# Patient Record
Sex: Male | Born: 1960 | Race: Black or African American | Hispanic: No | Marital: Single | Smoking: Current every day smoker
Health system: Southern US, Community
[De-identification: ages and names within clinical notes are randomized; demographics above are authoritative.]

## PROBLEM LIST (undated history)

## (undated) DIAGNOSIS — I10 Essential (primary) hypertension: Secondary | ICD-10-CM

## (undated) DIAGNOSIS — D649 Anemia, unspecified: Secondary | ICD-10-CM

## (undated) DIAGNOSIS — K219 Gastro-esophageal reflux disease without esophagitis: Secondary | ICD-10-CM

## (undated) DIAGNOSIS — F32A Depression, unspecified: Secondary | ICD-10-CM

## (undated) DIAGNOSIS — R531 Weakness: Secondary | ICD-10-CM

## (undated) DIAGNOSIS — E785 Hyperlipidemia, unspecified: Secondary | ICD-10-CM

## (undated) DIAGNOSIS — F329 Major depressive disorder, single episode, unspecified: Secondary | ICD-10-CM

## (undated) DIAGNOSIS — G35 Multiple sclerosis: Secondary | ICD-10-CM

## (undated) DIAGNOSIS — R4781 Slurred speech: Secondary | ICD-10-CM

## (undated) DIAGNOSIS — R1310 Dysphagia, unspecified: Secondary | ICD-10-CM

## (undated) DIAGNOSIS — E119 Type 2 diabetes mellitus without complications: Secondary | ICD-10-CM

## (undated) HISTORY — PX: APPENDECTOMY: SHX54

---

## 1998-08-02 ENCOUNTER — Emergency Department (HOSPITAL_COMMUNITY): Admission: EM | Admit: 1998-08-02 | Discharge: 1998-08-02 | Payer: Self-pay | Admitting: Emergency Medicine

## 2001-12-12 ENCOUNTER — Emergency Department (HOSPITAL_COMMUNITY): Admission: EM | Admit: 2001-12-12 | Discharge: 2001-12-12 | Payer: Self-pay

## 2004-08-27 ENCOUNTER — Encounter: Admission: RE | Admit: 2004-08-27 | Discharge: 2004-08-27 | Payer: Self-pay | Admitting: Neurology

## 2005-06-29 ENCOUNTER — Ambulatory Visit: Payer: Self-pay | Admitting: Gastroenterology

## 2005-07-06 ENCOUNTER — Ambulatory Visit: Payer: Self-pay | Admitting: Gastroenterology

## 2005-07-06 ENCOUNTER — Encounter (INDEPENDENT_AMBULATORY_CARE_PROVIDER_SITE_OTHER): Payer: Self-pay | Admitting: *Deleted

## 2005-08-14 ENCOUNTER — Ambulatory Visit: Payer: Self-pay | Admitting: Gastroenterology

## 2007-06-26 ENCOUNTER — Inpatient Hospital Stay (HOSPITAL_COMMUNITY): Admission: EM | Admit: 2007-06-26 | Discharge: 2007-06-28 | Payer: Self-pay | Admitting: Emergency Medicine

## 2008-10-06 ENCOUNTER — Encounter (INDEPENDENT_AMBULATORY_CARE_PROVIDER_SITE_OTHER): Payer: Self-pay | Admitting: *Deleted

## 2008-11-23 ENCOUNTER — Ambulatory Visit: Payer: Self-pay | Admitting: Gastroenterology

## 2008-11-23 DIAGNOSIS — E119 Type 2 diabetes mellitus without complications: Secondary | ICD-10-CM | POA: Insufficient documentation

## 2008-11-23 DIAGNOSIS — K219 Gastro-esophageal reflux disease without esophagitis: Secondary | ICD-10-CM

## 2008-12-09 ENCOUNTER — Ambulatory Visit: Payer: Self-pay | Admitting: Gastroenterology

## 2008-12-09 ENCOUNTER — Encounter: Payer: Self-pay | Admitting: Gastroenterology

## 2008-12-10 ENCOUNTER — Encounter: Payer: Self-pay | Admitting: Gastroenterology

## 2009-07-09 ENCOUNTER — Emergency Department (HOSPITAL_COMMUNITY): Admission: EM | Admit: 2009-07-09 | Discharge: 2009-07-09 | Payer: Self-pay | Admitting: Emergency Medicine

## 2010-06-02 LAB — GLUCOSE, CAPILLARY
Glucose-Capillary: 71 mg/dL (ref 70–99)
Glucose-Capillary: 90 mg/dL (ref 70–99)

## 2010-07-12 NOTE — H&P (Signed)
Mario Shields, Mario Shields                 ACCOUNT NO.:  [id number]   MEDICAL RECORD NO.:  [id number]          PATIENT TYPE:  INP   LOCATION:  6707                         FACILITY:  [facility]   PHYSICIAN:  Tera Mater. [place], M.D. DATE OF BIRTH:  June 10, 1960   DATE OF ADMISSION:  06/26/2007  DATE OF DISCHARGE:                              HISTORY & PHYSICAL   HISTORY OF PRESENT ILLNESS:  Mr. Swab is a 50 year old black male with  a history of type 2 diabetes diagnosed about a year ago, a history of  long-standing multiple sclerosis, a history of hyperlipidemia and  hypertension.  He has been feeling less well for about the last week to  10 days.  He has had frequent urination, extra thirst, blurred vision,  some headaches, some weakness, and just general malaise.  He thought it  might be a flare of his MS because he has had flares like this in the  past.  He felt so bad on the last day that he really could not get much  out of bed.  His blood sugar was above the level of meter today, and he  was brought to the emergency room for evaluation.  Here, the initial  evaluation suggested early diabetic ketoacidosis; and the patient is now  being admitted to evaluate this.  The patient's last hemoglobin A1c in  our office was 6.8% in January 2009 on metformin.  He has had some  weight loss, but he has not really measured it.  He notes no breathing  trouble, no chest pain.  He threw up one time today.  He notes no  abdominal pain.  No other aches and pains at the present time.   PAST MEDICAL HISTORY:  Past medical history includes a  relapsing/remitting multiple sclerosis since 1992 treated with biologic  agents in the form of Avonex.  He has a history hypertension, a history  of hyperlipidemia, history of depression, history of gastroesophageal  reflux and esophageal stricture.   SOCIAL HISTORY:  Social history reveals he is single.  He quit smoking 3  weeks ago.  He is disabled Dispensing optician.   FAMILY HISTORY:  Father died of cancer of the lung.  Mother has  hypertension, coronary disease, diabetes, and osteoarthritis.   PAST SURGICAL HISTORY:  Other past history includes appendectomy and  esophageal dilation.   ALLERGIES:  Allergies include an intolerance to BETASERON which causes  myalgias.  No drug allergies are noted.   MEDICATIONS:  His medication list at the present includes  1. Diovan HCT; we are not sure of the dose.  2. Aciphex 20.  3. Lipitor 20.  4. Metformin at a dose of 500 once a day started in June 2008.  5. Avonex weekly; we do not have the dose.   PHYSICAL EXAMINATION:  VITAL SIGNS:  On exam here in the emergency room,  blood pressure initially is 144/92, pulse 105 in a sinus tachycardia,  respirations 18 and unlabored.  No Kussmaul pattern, O2 saturation is  97%.  Temperature is 100.4.  GENERAL:  We have a thin but  fairly healthy-appearing black male in no  distress.  HEENT:  Sclerae anicteric.  Extraocular movements are intact with no lid  lag or nystagmus.  Nasopharynx is clear with no darkening or abnormal  findings.  There is a slight wax impaction in the left ear.  Right  tympanic membrane is clear.  Oral mucous membranes are dry but clear  with no thrush or other lesions.  NECK:  Supple without JVD, bruits, or thyromegaly.  LUNGS: Clear without wheezes, rales, or rhonchi.  No accessory muscles  are in use.  HEART:  Regular and somewhat hyperdynamic with a systolic flow murmur.  ABDOMEN:  Entirely soft, nontender with hypoactive but present in all  quadrant bowel sounds.  EXTREMITIES:  Strong distal pulses, no edemas.  SKIN:  Skin turgor is reasonably good with no tenting.  No rashes are  noted.  NEUROLOGIC:  The patient is awake, alert.  His mentation is excellent.  Speech is clear.  No tremors present.  Grip appears equal.  Gait is not  tested.   LABORATORY DATA:  His blood gas, pH 7.331, PCO2 of 35, venous pO2 of 35,  and we do not seem to  have a O2 on this particular finding.  O2  saturation was 63%.  This was a venous sample.  His sodium was 136,  potassium 4.4, chloride 100, CO2 of 17, BUN 20, creatinine 1.37, glucose  559, and calcium is 10.1.  Serum ketones are small and present as blood  acetone.  White count is 11,900, hemoglobin 16.2, platelets 229,000, and  MCV is 87.5.   ASSESSMENT:  In summary, we have a 50 year old black male with a history  of diabetes type 2 now presenting with what appears to be early diabetic  ketoacidosis.  He has a volume deficit, diminished bicarbonate, elevated  anion gap with a sugar greater than 500.  The patient is not too  terribly toxic at the present.  No evidence of rhabdomyolysis or  underlying infection causing this.  I am concerned that he is becoming  insulin deficient, however.  The fact that he does have serum ketone  response in the blood stream is worrisome.  At the present, it can  treated with IV Glucommander, fluids and reassess the situation as time  goes by here.  His electrolytes are in reasonable disorder, and he has  no evidence of renal dysfunction of note.  We will need to watch his  potassium carefully.  A C-peptide will be checked and will give [place] a bit  of an insight into whether or not he is insulin deficient.  During this  stressful time, it of course should be quite high.  GAD antibodies are  much less predictive in African Americans, but we are going to try it  and see; if it is positive, it will be very telling test for [place].  He  will probably be transitioned to insulin tomorrow depending on how he  does.  All of this was explained in detail to the patient, and questions  were answered.           ______________________________  Tera Mater Evlyn Kanner, M.D.     SAS/MEDQ  D:  06/26/2007  T:  06/27/2007  Job:  [id number]

## 2010-07-15 NOTE — Discharge Summary (Signed)
Mario Shields, Mario Shields                 ACCOUNT NO.:  [id number]   MEDICAL RECORD NO.:  [id number]          PATIENT TYPE:  INP   LOCATION:  6707                         FACILITY:  [facility]   PHYSICIAN:  Barry Dienes. Eloise Harman, M.D.DATE OF BIRTH:  Aug 26, 1960   DATE OF ADMISSION:  06/26/2007  DATE OF DISCHARGE:  06/28/2007                               DISCHARGE SUMMARY   PERTINENT FINDINGS:  The patient is a 50 year old African-American man  with a history of diabetes mellitus type 2, multiple sclerosis,  hypertension, and gastroesophageal reflux who presented to the emergency  room with polyuria, polydipsia, and fatigue.  The emergency room workup  was also notable for mild ketoacidosis and the patient was admitted for  further evaluation.  See admission history and physical for details of  his condition at admission.   HOSPITAL COURSE:  The patient was admitted to a medical bed without  telemetry.  He was started on IV insulin via the Glucommander system as  his initial blood glucose level was 559 with total CO2 of 17 and venous  pH of 7.33.  His blood glucose levels rapidly normalized and he was  switched to Lantus insulin with sliding scale NovoLog insulin as needed.  He was also treated with high dose IV fluids as he was somewhat  dehydrated on admission.  Nursing staff showed him initial information  about how to administer insulin and he was given a glucose monitor and  shown how to use this prior to discharge.   PROCEDURES:  IV insulin via Glucommander.   CONSULTATIONS:  None.   CONDITION ON DISCHARGE:  He felt at his baseline with no nausea,  headaches, or dizziness.  His vision was still a bit blurry.   PHYSICAL EXAMINATION:  VITAL SIGNS:  Most recent vital signs include  blood pressure 130/82, pulse 66, respirations 18, temperature 98.6,  pulse oxygen saturation 98% on room air.  GENERAL:  He is a well-nourished, well-developed African-American man  who is in no apparent  distress.  CHEST:  Clear to auscultation.  HEART:  Regular rate and rhythm.  NEUROLOGIC:  Significant that he was alert and well-oriented.  He could  move all extremities well and walk without difficulty.   PERTINENT LABORATORY TESTS:  Serum sodium 139, potassium 3.9, chloride  111, carbon dioxide 22, BUN 7, creatinine 0.94, glucose 250.  Most  recent capillary blood glucose levels have been 377 and 251.  White  blood cell count was 12.8 with hemoglobin 12.8, hematocrit 37.3,  platelets 187, and 74% neutrophils.  A C-peptide level was 0.76 (0.80-  3.90) at the time and the blood glucose level was approximately 250.  A  glutamic acid decarboxylase level was less than 1.0 (less than 1.5  units/mL).   DISCHARGE DIAGNOSES:  1. Diabetes mellitus type 1, new onset.  2. Diabetic ketoacidosis, mild and resolved.  3. Hypertension.  4. Gastroesophageal reflux disease.  5. Multiple sclerosis.  6. Hyperlipidemia.   DISCHARGE MEDICATIONS:  1. Lantus insulin 20 units subcutaneous every a.m.  2. NovoLog insulin before meals based on premeal fasting glucose level  as follows:  Blood glucose level less than 100 give no insulin, for      100-150 give 2 units, for 151-200 give 4 units, for over 200 give 6      units.  3. Diovan HCT 1 tablet daily.  4. Aciphex 20 mg daily.  5. Avonex once weekly per usual.  6. Lipitor 20 mg daily.   DISPOSITION AND FOLLOWUP:  He was advised to see our office diabetes  educator, Edison Pace next week and was given a telephone number to  schedule that visit.  He was also advised to see [name] at  [facility] in approximately 2 weeks following  discharge.           ______________________________  Barry Dienes. Eloise Harman, M.D.     DGP/MEDQ  D:  08/01/2007  T:  08/01/2007  Job:  [id number]

## 2010-11-22 LAB — BASIC METABOLIC PANEL WITH GFR
BUN: 11
BUN: 13
CO2: 17 — ABNORMAL LOW
CO2: 22
CO2: 26
Calcium: 8.9
Calcium: 9.3
Creatinine, Ser: 0.8
Creatinine, Ser: 0.92
GFR calc non Af Amer: >60
GFR calc non Af Amer: >60
Glucose, Bld: 221 — ABNORMAL HIGH
Glucose, Bld: 559
Glucose, Bld: 73
Potassium: 4.4
Sodium: 136
Sodium: 142
Sodium: 144

## 2010-11-22 LAB — URINALYSIS, ROUTINE W REFLEX MICROSCOPIC
Bilirubin Urine: NEGATIVE
Glucose, UA: >1000 — AB
Hgb urine dipstick: NEGATIVE
Ketones, ur: >80 — AB
Leukocytes, UA: NEGATIVE
Nitrite: NEGATIVE
Protein, ur: NEGATIVE
Specific Gravity, Urine: 1.039 — ABNORMAL HIGH
Urobilinogen, UA: 0.2
pH: 5

## 2010-11-22 LAB — DIFFERENTIAL
Basophils Absolute: 0
Basophils Relative: 0
Eosinophils Absolute: 0
Eosinophils Relative: 0
Lymphocytes Relative: 20
Lymphs Abs: 2.4
Monocytes Absolute: 0.6
Monocytes Relative: 5
Neutro Abs: 8.8 — ABNORMAL HIGH
Neutrophils Relative %: 74

## 2010-11-22 LAB — POCT I-STAT 3, VENOUS BLOOD GAS (G3P V)
Acid-base deficit: 6 — ABNORMAL HIGH
Bicarbonate: 18.8 — ABNORMAL LOW
O2 Saturation: 63
Operator id: 284251
TCO2: 20
pCO2, Ven: 35.6 — ABNORMAL LOW
pH, Ven: 7.331 — ABNORMAL HIGH
pO2, Ven: 35

## 2010-11-22 LAB — CBC
HCT: 37.3 — ABNORMAL LOW
HCT: 45.6
Hemoglobin: 12.8 — ABNORMAL LOW
Hemoglobin: 16.2
MCHC: 34.3
MCHC: 35.6
MCV: 87.5
MCV: 88.6
Platelets: 187
Platelets: 229
RBC: 4.21 — ABNORMAL LOW
RBC: 5.21
RDW: 12.7
RDW: 13.1
WBC: 11.9 — ABNORMAL HIGH
WBC: 12.8 — ABNORMAL HIGH

## 2010-11-22 LAB — BASIC METABOLIC PANEL
BUN: 20
Calcium: 10.1
Chloride: 100
Chloride: 111
Chloride: 115 — ABNORMAL HIGH
Creatinine, Ser: 1.37
GFR calc Af Amer: >60
GFR calc Af Amer: >60
GFR calc Af Amer: >60
GFR calc non Af Amer: 55 — ABNORMAL LOW
Potassium: 4.1
Potassium: 4.4

## 2010-11-22 LAB — GLUTAMIC ACID DECARBOXYLASE AUTO ABS: Glutamic Acid Decarb Ab: <1 U/mL (ref <1.5)

## 2010-11-22 LAB — URINE MICROSCOPIC-ADD ON

## 2010-11-22 LAB — KETONES, QUALITATIVE

## 2010-11-22 LAB — C-PEPTIDE: C-Peptide: 0.76 — ABNORMAL LOW

## 2012-04-15 ENCOUNTER — Encounter (HOSPITAL_COMMUNITY): Payer: Self-pay | Admitting: *Deleted

## 2012-04-15 ENCOUNTER — Emergency Department (HOSPITAL_COMMUNITY): Payer: Medicare Other

## 2012-04-15 ENCOUNTER — Inpatient Hospital Stay (HOSPITAL_COMMUNITY)
Admission: EM | Admit: 2012-04-15 | Discharge: 2012-04-19 | DRG: 060 | Disposition: A | Discharge status: Home Health | Payer: Medicare Other | Attending: Internal Medicine | Admitting: Internal Medicine

## 2012-04-15 ENCOUNTER — Inpatient Hospital Stay (HOSPITAL_COMMUNITY): Payer: Medicare Other

## 2012-04-15 DIAGNOSIS — D649 Anemia, unspecified: Secondary | ICD-10-CM | POA: Diagnosis present

## 2012-04-15 DIAGNOSIS — E119 Type 2 diabetes mellitus without complications: Secondary | ICD-10-CM

## 2012-04-15 DIAGNOSIS — Z794 Long term (current) use of insulin: Secondary | ICD-10-CM

## 2012-04-15 DIAGNOSIS — R2681 Unsteadiness on feet: Secondary | ICD-10-CM

## 2012-04-15 DIAGNOSIS — E785 Hyperlipidemia, unspecified: Secondary | ICD-10-CM | POA: Diagnosis present

## 2012-04-15 DIAGNOSIS — E109 Type 1 diabetes mellitus without complications: Secondary | ICD-10-CM | POA: Diagnosis present

## 2012-04-15 DIAGNOSIS — G8190 Hemiplegia, unspecified affecting unspecified side: Secondary | ICD-10-CM

## 2012-04-15 DIAGNOSIS — I10 Essential (primary) hypertension: Secondary | ICD-10-CM | POA: Diagnosis present

## 2012-04-15 DIAGNOSIS — R531 Weakness: Secondary | ICD-10-CM

## 2012-04-15 DIAGNOSIS — G35 Multiple sclerosis: Principal | ICD-10-CM | POA: Diagnosis present

## 2012-04-15 DIAGNOSIS — F17200 Nicotine dependence, unspecified, uncomplicated: Secondary | ICD-10-CM | POA: Diagnosis present

## 2012-04-15 DIAGNOSIS — K219 Gastro-esophageal reflux disease without esophagitis: Secondary | ICD-10-CM | POA: Diagnosis present

## 2012-04-15 DIAGNOSIS — R209 Unspecified disturbances of skin sensation: Secondary | ICD-10-CM

## 2012-04-15 DIAGNOSIS — Z79899 Other long term (current) drug therapy: Secondary | ICD-10-CM

## 2012-04-15 HISTORY — DX: Essential (primary) hypertension: I10

## 2012-04-15 HISTORY — DX: Multiple sclerosis: G35

## 2012-04-15 LAB — COMPREHENSIVE METABOLIC PANEL
Alkaline Phosphatase: 93 U/L (ref 39–117)
BUN: 11 mg/dL (ref 6–23)
GFR calc Af Amer: >90 mL/min (ref >90)
Glucose, Bld: 416 mg/dL — ABNORMAL HIGH (ref 70–99)
Potassium: 4.2 mEq/L (ref 3.5–5.1)
Total Bilirubin: 0.3 mg/dL (ref 0.3–1.2)
Total Protein: 7.8 g/dL (ref 6.0–8.3)

## 2012-04-15 LAB — URINALYSIS, ROUTINE W REFLEX MICROSCOPIC
Bilirubin Urine: NEGATIVE
Ketones, ur: 15 mg/dL — AB
Nitrite: NEGATIVE
Protein, ur: NEGATIVE mg/dL
Specific Gravity, Urine: 1.037 — ABNORMAL HIGH (ref 1.005–1.030)
Urobilinogen, UA: 0.2 mg/dL (ref 0.0–1.0)

## 2012-04-15 LAB — CBC WITH DIFFERENTIAL/PLATELET
Basophils Absolute: 0 10*3/uL (ref 0.0–0.1)
HCT: 46.7 % (ref 39.0–52.0)
Lymphs Abs: 4.2 10*3/uL — ABNORMAL HIGH (ref 0.7–4.0)
MCH: 31.4 pg (ref 26.0–34.0)
MCHC: 36.2 g/dL — ABNORMAL HIGH (ref 30.0–36.0)
MCV: 86.8 fL (ref 78.0–100.0)
Monocytes Relative: 7 % (ref 3–12)
Neutro Abs: 9.9 10*3/uL — ABNORMAL HIGH (ref 1.7–7.7)
RDW: 13 % (ref 11.5–15.5)
WBC: 15.4 10*3/uL — ABNORMAL HIGH (ref 4.0–10.5)

## 2012-04-15 LAB — GLUCOSE, CAPILLARY
Glucose-Capillary: 305 mg/dL — ABNORMAL HIGH (ref 70–99)
Glucose-Capillary: 418 mg/dL — ABNORMAL HIGH (ref 70–99)

## 2012-04-15 MED ORDER — ONDANSETRON HCL 4 MG/2ML IJ SOLN: 4.0000 mg | Freq: Four times a day (QID) | INTRAMUSCULAR | Status: DC | PRN

## 2012-04-15 MED ORDER — METHYLPREDNISOLONE SODIUM SUCC 1000 MG IJ SOLR
1000.0000 mg | Freq: Every day | INTRAMUSCULAR | Status: AC
  Administered 2012-04-15 – 2012-04-19 (×5): 1000 mg via INTRAVENOUS
  Filled 2012-04-15 (×7): qty 8

## 2012-04-15 MED ORDER — SODIUM CHLORIDE 0.9 % IV SOLN
INTRAVENOUS | Status: DC
  Administered 2012-04-15: 1000 mL via INTRAVENOUS
  Administered 2012-04-16 – 2012-04-17 (×5): via INTRAVENOUS
  Administered 2012-04-17: 1000 mL via INTRAVENOUS
  Administered 2012-04-19: 50 mL/h via INTRAVENOUS

## 2012-04-15 MED ORDER — ADULT MULTIVITAMIN W/MINERALS CH
1.0000 | ORAL_TABLET | Freq: Every day | ORAL | Status: DC
  Administered 2012-04-16 – 2012-04-19 (×4): 1 via ORAL
  Filled 2012-04-15 (×4): qty 1

## 2012-04-15 MED ORDER — POLYETHYLENE GLYCOL 3350 17 G PO PACK
17.0000 g | PACK | Freq: Every day | ORAL | Status: DC | PRN
  Filled 2012-04-15: qty 1

## 2012-04-15 MED ORDER — IRBESARTAN 300 MG PO TABS
300.0000 mg | ORAL_TABLET | Freq: Every day | ORAL | Status: DC
  Administered 2012-04-16 – 2012-04-19 (×4): 300 mg via ORAL
  Filled 2012-04-15 (×4): qty 1

## 2012-04-15 MED ORDER — WHITE PETROLATUM GEL
Status: AC
  Administered 2012-04-15: 23:00:00
  Filled 2012-04-15: qty 5

## 2012-04-15 MED ORDER — ONDANSETRON HCL 4 MG PO TABS: 4.0000 mg | ORAL_TABLET | Freq: Four times a day (QID) | ORAL | Status: DC | PRN

## 2012-04-15 MED ORDER — VALSARTAN-HYDROCHLOROTHIAZIDE 320-12.5 MG PO TABS: 1.0000 | ORAL_TABLET | Freq: Every day | ORAL | Status: DC

## 2012-04-15 MED ORDER — INSULIN ASPART 100 UNIT/ML ~~LOC~~ SOLN
10.0000 [IU] | Freq: Once | SUBCUTANEOUS | Status: AC
  Administered 2012-04-15: 10 [IU] via INTRAVENOUS
  Filled 2012-04-15: qty 1

## 2012-04-15 MED ORDER — SODIUM CHLORIDE 0.9 % IV BOLUS (SEPSIS): 500.0000 mL | Freq: Once | INTRAVENOUS | Status: DC

## 2012-04-15 MED ORDER — ENOXAPARIN SODIUM 40 MG/0.4ML ~~LOC~~ SOLN
40.0000 mg | SUBCUTANEOUS | Status: DC
Start: 2012-04-15 — End: 2012-04-19
  Administered 2012-04-15 – 2012-04-18 (×4): 40 mg via SUBCUTANEOUS
  Filled 2012-04-15 (×5): qty 0.4

## 2012-04-15 MED ORDER — ZOLPIDEM TARTRATE 5 MG PO TABS
5.0000 mg | ORAL_TABLET | Freq: Every evening | ORAL | Status: DC | PRN
  Administered 2012-04-19: 5 mg via ORAL
  Filled 2012-04-15: qty 1

## 2012-04-15 MED ORDER — ALUM & MAG HYDROXIDE-SIMETH 200-200-20 MG/5ML PO SUSP
30.0000 mL | Freq: Four times a day (QID) | ORAL | Status: DC | PRN
  Administered 2012-04-15: 30 mL via ORAL
  Filled 2012-04-15: qty 30

## 2012-04-15 MED ORDER — SODIUM CHLORIDE 0.9 % IV BOLUS (SEPSIS)
500.0000 mL | Freq: Once | INTRAVENOUS | Status: AC
  Administered 2012-04-15 (×2): 500 mL via INTRAVENOUS

## 2012-04-15 MED ORDER — HYDROCODONE-ACETAMINOPHEN 5-325 MG PO TABS
1.0000 | ORAL_TABLET | ORAL | Status: DC | PRN
  Filled 2012-04-15: qty 2

## 2012-04-15 MED ORDER — ACETAMINOPHEN 650 MG RE SUPP: 650.0000 mg | Freq: Four times a day (QID) | RECTAL | Status: DC | PRN

## 2012-04-15 MED ORDER — INSULIN ASPART 100 UNIT/ML ~~LOC~~ SOLN
0.0000 [IU] | SUBCUTANEOUS | Status: DC
  Administered 2012-04-15: 8 [IU] via SUBCUTANEOUS
  Administered 2012-04-16 (×2): 11 [IU] via SUBCUTANEOUS
  Administered 2012-04-16: 5 [IU] via SUBCUTANEOUS
  Administered 2012-04-16: 8 [IU] via SUBCUTANEOUS
  Administered 2012-04-16: 11 [IU] via SUBCUTANEOUS
  Administered 2012-04-17: 5 [IU] via SUBCUTANEOUS
  Administered 2012-04-17 (×2): 3 [IU] via SUBCUTANEOUS
  Administered 2012-04-17: 5 [IU] via SUBCUTANEOUS
  Administered 2012-04-17 (×2): 15 [IU] via SUBCUTANEOUS
  Administered 2012-04-17: 3 [IU] via SUBCUTANEOUS
  Administered 2012-04-18: 8 [IU] via SUBCUTANEOUS
  Administered 2012-04-18: 5 [IU] via SUBCUTANEOUS
  Administered 2012-04-18: 8 [IU] via SUBCUTANEOUS
  Administered 2012-04-18: 2 [IU] via SUBCUTANEOUS
  Administered 2012-04-19 (×2): 3 [IU] via SUBCUTANEOUS
  Administered 2012-04-19: 5 [IU] via SUBCUTANEOUS
  Administered 2012-04-19: 2 [IU] via SUBCUTANEOUS
  Filled 2012-04-15: qty 1

## 2012-04-15 MED ORDER — PANTOPRAZOLE SODIUM 40 MG PO TBEC
40.0000 mg | DELAYED_RELEASE_TABLET | Freq: Every day | ORAL | Status: DC
  Administered 2012-04-16 – 2012-04-19 (×4): 40 mg via ORAL
  Filled 2012-04-15 (×4): qty 1

## 2012-04-15 MED ORDER — HYDROCHLOROTHIAZIDE 12.5 MG PO CAPS
12.5000 mg | ORAL_CAPSULE | Freq: Every day | ORAL | Status: DC
  Administered 2012-04-16 – 2012-04-17 (×2): 12.5 mg via ORAL
  Filled 2012-04-15 (×2): qty 1

## 2012-04-15 MED ORDER — ACETAMINOPHEN 325 MG PO TABS: 650.0000 mg | ORAL_TABLET | Freq: Four times a day (QID) | ORAL | Status: DC | PRN

## 2012-04-15 MED ORDER — GADOBENATE DIMEGLUMINE 529 MG/ML IV SOLN
15.0000 mL | Freq: Once | INTRAVENOUS | Status: AC | PRN
  Administered 2012-04-15: 15 mL via INTRAVENOUS

## 2012-04-15 MED ORDER — INSULIN GLARGINE 100 UNIT/ML ~~LOC~~ SOLN
30.0000 [IU] | Freq: Every day | SUBCUTANEOUS | Status: DC
  Administered 2012-04-15 – 2012-04-16 (×2): 30 [IU] via SUBCUTANEOUS
  Filled 2012-04-15: qty 1

## 2012-04-15 NOTE — ED Notes (Signed)
Patient transported to CT 

## 2012-04-15 NOTE — ED Notes (Signed)
Internal Medicine at the bedside. 

## 2012-04-15 NOTE — ED Notes (Signed)
Pt's family reports that they noticed left side facial droop and left side weakness this past Saturday. States that this happened prior when pt had an exacerbation of his MS.

## 2012-04-15 NOTE — Consult Note (Signed)
NEURO HOSPITALIST CONSULT NOTE    Reason for Consult: right face weakness, slurred speech, and worsening imbalance.  HPI:                                                                                                                                          Mario Shields is an 52 y.o. male with a past medical history significant for hypertension, diabetes mellitus on insulin, heavy smoker, multiple sclerosis diagnosed more than 20 years and off disease modifying for many years, brought to the hospital with slurred speech, right face weakness, and worsening bilateral lower extremity weakness and imbalance. He said that he took avonex and Betaseron many years ago but unfortunately had significant side effects and decided to stop treatment. Stated that he hasn't seen a neurologist since 2006. Mario Shields expressed that he is able to hardly walk with a cane, but in the past couple of months his balance and legs strength had gotten progressively worse. However, he said that his speech became slurred only few days ago and he was noted to have right face droopiness. Denies vertigo but complains of intermittent double vision. No difficulty swallowing, headache, numbness, confusion, or memory changes. No bladder or bowel impairment. Last MS exacerbation apparently in 2006, treated with steroids. Denies recent fever or infection but elevated white count above 15,000 today. Furthermore, blood sugar above 400. CT brain without contrast showed no acute intracranial abnormality.      Past Medical History  Diagnosis Date  . MS (multiple sclerosis)   . Hypertension   . Diabetes mellitus without complication     History reviewed. No pertinent past surgical history.  No family history on file.  Family History: unknown.   Social History:  reports that he has been smoking Cigarettes.  He has been smoking about 1.00 pack per day. He does not have any smokeless tobacco history on file.  He reports that he does not drink alcohol or use illicit drugs.  No Known Allergies  MEDICATIONS:                                                                                                                     I have reviewed the patient's current medications.   ROS:  History obtained from the patient, family, and chart review.  General ROS: negative for - chills, fatigue, fever, night sweats, weight gain or weight loss Psychological ROS: negative for - behavioral disorder, hallucinations, memory difficulties, mood swings or suicidal ideation Ophthalmic ROS: negative for - blurry vision, double vision, eye pain or loss of vision ENT ROS: negative for - epistaxis, nasal discharge, oral lesions, sore throat, tinnitus or vertigo Allergy and Immunology ROS: negative for - hives or itchy/watery eyes Hematological and Lymphatic ROS: negative for - bleeding problems, bruising or swollen lymph nodes Endocrine ROS: negative for - galactorrhea, hair pattern changes, polydipsia/polyuria or temperature intolerance Respiratory ROS: negative for - cough, hemoptysis, shortness of breath or wheezing Cardiovascular ROS: negative for - chest pain, dyspnea on exertion, edema or irregular heartbeat Gastrointestinal ROS: negative for - abdominal pain, diarrhea, hematemesis, nausea/vomiting or stool incontinence Genito-Urinary ROS: negative for - dysuria, hematuria, incontinence or urinary frequency/urgency Musculoskeletal ROS: negative for - joint swelling. Neurological ROS: as noted in HPI Dermatological ROS: negative for rash and skin lesion changes   Physical exam: pleasant male in no apparent distress.Blood pressure 130/81, pulse 67, temperature 98.6 F (37 C), temperature source Oral, resp. rate 13, SpO2 100.00%. Head: normocephalic. Neck: supple, no bruits, no  JVD. Cardiac: no murmurs. Lungs: clear. Abdomen: soft, no tender, no mass. Extremities: no edema.  Neurologic Examination:                                                                                                      Mental Status: Alert, awake, oriented x 4, thought content appropriate, comprehension, naming, and repetition intact. Mild dysarthria but no aphasia. Cranial Nerves: II: Discs flat bilaterally; Visual fields grossly normal, pupils equal, round, reactive to light and accommodation III,IV, VI: ptosis not present, extra-ocular motions intact bilaterally V: facial light touch sensation normal bilaterally. VII: right face weakness, central type. VIII: hearing normal bilaterally IX,X: gag reflex present XI: bilateral shoulder shrug XII: midline tongue extension Motor: 5/5 upper extremities bilaterally. There is weakness (3+/5 lower extremities) Tone and bulk:normal tone throughout; no atrophy noted. Sensory: Pinprick and light touch intact throughout, bilaterally Deep Tendon Reflexes: generalized hyperreflexia without clonus. Plantars: Right: upgoing   Left: downgoing Cerebellar: Mild bilateral intention tremor. Can not perform heel to shin due to weakness. Gait: no tested.  CV: pulses palpable throughout    No results found for this basename: cbc, bmp, coags, chol, tri, ldl, hga1c    Results for orders placed during the hospital encounter of 04/15/12 (from the past 48 hour(s))  GLUCOSE, CAPILLARY     Status: Abnormal   Collection Time    04/15/12  3:48 PM      Result Value Range   Glucose-Capillary 418 (*) 70 - 99 mg/dL  CBC WITH DIFFERENTIAL     Status: Abnormal   Collection Time    04/15/12  3:59 PM      Result Value Range   WBC 15.4 (*) 4.0 - 10.5 K/uL   RBC 5.38  4.22 - 5.81 MIL/uL   Hemoglobin 16.9  13.0 - 17.0 g/dL  HCT 46.7  39.0 - 52.0 %   MCV 86.8  78.0 - 100.0 fL   MCH 31.4  26.0 - 34.0 pg   MCHC 36.2 (*) 30.0 - 36.0 g/dL   RDW 78.2  95.6  - 21.3 %   Platelets 219  150 - 400 K/uL   Neutrophils Relative 65  43 - 77 %   Lymphocytes Relative 27  12 - 46 %   Monocytes Relative 7  3 - 12 %   Eosinophils Relative 1  0 - 5 %   Basophils Relative 0  0 - 1 %   Neutro Abs 9.9 (*) 1.7 - 7.7 K/uL   Lymphs Abs 4.2 (*) 0.7 - 4.0 K/uL   Monocytes Absolute 1.1 (*) 0.1 - 1.0 K/uL   Eosinophils Absolute 0.2  0.0 - 0.7 K/uL   Basophils Absolute 0.0  0.0 - 0.1 K/uL   RBC Morphology TEARDROP CELLS     WBC Morphology ATYPICAL LYMPHOCYTES     Comment: TOXIC GRANULATION  COMPREHENSIVE METABOLIC PANEL     Status: Abnormal   Collection Time    04/15/12  3:59 PM      Result Value Range   Sodium 136  135 - 145 mEq/L   Potassium 4.2  3.5 - 5.1 mEq/L   Comment: HEMOLYSIS AT THIS LEVEL MAY AFFECT RESULT   Chloride 98  96 - 112 mEq/L   CO2 22  19 - 32 mEq/L   Glucose, Bld 416 (*) 70 - 99 mg/dL   BUN 11  6 - 23 mg/dL   Creatinine, Ser 0.86  0.50 - 1.35 mg/dL   Calcium 57.8  8.4 - 46.9 mg/dL   Total Protein 7.8  6.0 - 8.3 g/dL   Albumin 4.4  3.5 - 5.2 g/dL   AST 23  0 - 37 U/L   ALT 25  0 - 53 U/L   Alkaline Phosphatase 93  39 - 117 U/L   Total Bilirubin 0.3  0.3 - 1.2 mg/dL   GFR calc non Af Amer >90  >90 mL/min   GFR calc Af Amer >90  >90 mL/min   Comment:            The eGFR has been calculated     using the CKD EPI equation.     This calculation has not been     validated in all clinical     situations.     eGFR's persistently     <90 mL/min signify     possible Chronic Kidney Disease.  URINALYSIS, ROUTINE W REFLEX MICROSCOPIC     Status: Abnormal   Collection Time    04/15/12  4:20 PM      Result Value Range   Color, Urine YELLOW  YELLOW   APPearance CLEAR  CLEAR   Specific Gravity, Urine 1.037 (*) 1.005 - 1.030   pH 5.0  5.0 - 8.0   Glucose, UA >1000 (*) NEGATIVE mg/dL   Hgb urine dipstick NEGATIVE  NEGATIVE   Bilirubin Urine NEGATIVE  NEGATIVE   Ketones, ur 15 (*) NEGATIVE mg/dL   Protein, ur NEGATIVE  NEGATIVE mg/dL    Urobilinogen, UA 0.2  0.0 - 1.0 mg/dL   Nitrite NEGATIVE  NEGATIVE   Leukocytes, UA NEGATIVE  NEGATIVE  URINE MICROSCOPIC-ADD ON     Status: None   Collection Time    04/15/12  4:20 PM      Result Value Range   WBC, UA 0-2  <3 WBC/hpf  GLUCOSE,  CAPILLARY     Status: Abnormal   Collection Time    04/15/12  6:47 PM      Result Value Range   Glucose-Capillary 253 (*) 70 - 99 mg/dL   Comment 1 Documented in Chart     Comment 2 Notify RN      Dg Chest 2 View  04/15/2012  *RADIOLOGY REPORT*  Clinical Data: Evaluate for pneumonia  CHEST - 2 VIEW  Comparison: 07/09/2009  Findings: Normal heart size.  There is no pleural effusion or edema.  No airspace consolidation.  Review of the visualized osseous structures is unremarkable.  IMPRESSION:  1.  No acute cardiopulmonary abnormalities.  2.  Low lung volumes.   Original Report Authenticated By: Signa Kell, M.D.    Ct Head Wo Contrast  04/15/2012  *RADIOLOGY REPORT*  Clinical Data: Weakness.  Slurred speech.  Right facial droop.  CT HEAD WITHOUT CONTRAST  Technique:  Contiguous axial images were obtained from the base of the skull through the vertex without contrast.  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 There are chronic small vessel changes throughout the hemispheric white matter.  No sign of acute infarction, mass lesion, hemorrhage, hydrocephalus or extra-axial collection.  Sinuses are clear.  No calvarial abnormality.  IMPRESSION: No identifiable acute abnormality.  Chronic white matter changes most notable on the frontal regions left more than right.   Original Report Authenticated By: Paulina Fusi, M.D.      Assessment/Plan:  52 years old male with long standing history of MS who hasn't been on disease modifying therapy for quite some time, and comes in today with progressive worsening bilateral lower extremities, declining balance, and apparently new onset slurred speech and right face  weakness over the last couple of days. Neuro-exam as described above. 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  Therefore, I will suggest MRI brain-DWI and IV solumedrol 1 gram daily with very close and strict monitoring of his blood sugar. Will be happy to follow this pleasant patient with you.  Mario Portela, MD [facility] [phone]  04/15/2012, 8:37 PM

## 2012-04-15 NOTE — ED Provider Notes (Signed)
History     CSN: [id number]  Arrival date & time 04/15/12  1519   First MD Initiated Contact with Patient 04/15/12 1603      Chief Complaint  Patient presents with  . Weakness  . Aphasia    (Consider location/radiation/quality/duration/timing/severity/associated sxs/prior treatment) HPI Comments: Patient with history of multiple sclerosis, diabetes, hypertension presents with worsening generalized weakness and right-sided facial weakness for the past 2-3 days. This has been noticed by the family. Patient has been ambulating but needed to hold onto objects when he walks. He is also had slurred speech. Patient and family deny recent illness including fever, nausea, vomiting, diarrhea. No cold symptoms. Patient has had similar flares in the past associated with generalized weakness but he has never had facial weakness. In 2006, the patient had a flare of his treated as an outpatient with steroids. Patient has not seen a neurologist since 2006. Onset of symptoms gradual. Course is constant. Nothing makes symptoms better or worse.  Patient is a 52 y.o. male presenting with weakness. The history is provided by the patient and a relative.  Weakness Associated symptoms include weakness. Pertinent negatives include no abdominal pain, chest pain, coughing, fever, headaches, myalgias, nausea, numbness, rash, sore throat or vomiting.    Past Medical History  Diagnosis Date  . MS (multiple sclerosis)   . Hypertension   . Diabetes mellitus without complication     History reviewed. No pertinent past surgical history.  No family history on file.  History  Substance Use Topics  . Smoking status: Current Every Day Smoker -- 1.00 packs/day    Types: Cigarettes  . Smokeless tobacco: Not on file  . Alcohol Use: No      Review of Systems  Constitutional: Negative for fever.  HENT: Negative for sore throat and rhinorrhea.   Eyes: Negative for redness.  Respiratory: Negative for cough.    Cardiovascular: Negative for chest pain.  Gastrointestinal: Negative for nausea, vomiting, abdominal pain and diarrhea.  Genitourinary: Negative for dysuria.  Musculoskeletal: Negative for myalgias.  Skin: Negative for rash.  Neurological: Positive for facial asymmetry, speech difficulty and weakness. Negative for numbness and headaches.  Psychiatric/Behavioral: Negative for confusion.    Allergies  Review of patient's allergies indicates not on file.  Home Medications   Current Outpatient Rx  Name  Route  Sig  Dispense  Refill  . insulin glargine (LANTUS) 100 UNIT/ML injection   Subcutaneous   Inject 30 Units into the skin daily.         . Multiple Vitamin (MULTIVITAMIN WITH MINERALS) TABS   Oral   Take 1 tablet by mouth daily.         . valsartan-hydrochlorothiazide (DIOVAN-HCT) 320-12.5 MG per tablet   Oral   Take 1 tablet by mouth daily.           BP 125/82  Pulse 103  Temp(Src) 98.6 F (37 C) (Oral)  SpO2 97%  Physical Exam  Nursing note and vitals reviewed. Constitutional: He is oriented to person, place, and time. He appears well-developed and well-nourished.  HENT:  Head: Normocephalic and atraumatic.  Right Ear: Tympanic membrane, external ear and ear canal normal.  Left Ear: Tympanic membrane, external ear and ear canal normal.  Nose: Nose normal.  Mouth/Throat: Uvula is midline, oropharynx is clear and moist and mucous membranes are normal.  Eyes: Conjunctivae, EOM and lids are normal. Pupils are equal, round, and reactive to light.  Neck: Normal range of motion. Neck supple.  Cardiovascular: Normal  rate and regular rhythm.   Pulmonary/Chest: Effort normal and breath sounds normal.  Abdominal: Soft. There is no tenderness.  Musculoskeletal: Normal range of motion.       Cervical back: He exhibits normal range of motion, no tenderness and no bony tenderness.  Neurological: He is alert and oriented to person, place, and time. He has normal strength  and normal reflexes. A cranial nerve deficit is present. No sensory deficit. He exhibits normal muscle tone. He displays a negative Romberg sign. Coordination and gait normal. GCS eye subscore is 4. GCS verbal subscore is 5. GCS motor subscore is 6.  4+/5 strength bilateral lower extremities. Right sided facial weakness when smiling. No facial droop at rest. No sensory deficits observed.   Skin: Skin is warm and dry.  Psychiatric: He has a normal mood and affect.    ED Course  Procedures (including critical care time)  Labs Reviewed  CBC WITH DIFFERENTIAL - Abnormal; Notable for the following:    WBC 15.4 (*)    MCHC 36.2 (*)    Neutro Abs 9.9 (*)    Lymphs Abs 4.2 (*)    Monocytes Absolute 1.1 (*)    All other components within normal limits  COMPREHENSIVE METABOLIC PANEL - Abnormal; Notable for the following:    Glucose, Bld 416 (*)    All other components within normal limits  GLUCOSE, CAPILLARY - Abnormal; Notable for the following:    Glucose-Capillary 418 (*)    All other components within normal limits  URINALYSIS, ROUTINE W REFLEX MICROSCOPIC - Abnormal; Notable for the following:    Specific Gravity, Urine 1.037 (*)    Glucose, UA >1000 (*)    Ketones, ur 15 (*)    All other components within normal limits  GLUCOSE, CAPILLARY - Abnormal; Notable for the following:    Glucose-Capillary 253 (*)    All other components within normal limits  URINE MICROSCOPIC-ADD ON   Dg Chest 2 View  04/15/2012  *RADIOLOGY REPORT*  Clinical Data: Evaluate for pneumonia  CHEST - 2 VIEW  Comparison: 07/09/2009  Findings: Normal heart size.  There is no pleural effusion or edema.  No airspace consolidation.  Review of the visualized osseous structures is unremarkable.  IMPRESSION:  1.  No acute cardiopulmonary abnormalities.  2.  Low lung volumes.   Original Report Authenticated By: Signa Kell, M.D.    Ct Head Wo Contrast  04/15/2012  *RADIOLOGY REPORT*  Clinical Data: Weakness.  Slurred  speech.  Right facial droop.  CT HEAD WITHOUT CONTRAST  Technique:  Contiguous axial images were obtained from the base of the skull through the vertex without contrast.  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 There are chronic small vessel changes throughout the hemispheric white matter.  No sign of acute infarction, mass lesion, hemorrhage, hydrocephalus or extra-axial collection.  Sinuses are clear.  No calvarial abnormality.  IMPRESSION: No identifiable acute abnormality.  Chronic white matter changes most notable on the frontal regions left more than right.   Original Report Authenticated By: Paulina Fusi, M.D.      1. Weakness   2. Multiple sclerosis     4:20 PM Patient seen and examined. Work-up initiated. D/w Dr. Freida Busman.    Vital signs reviewed and are as follows: Filed Vitals:   04/15/12 1527  BP: 125/82  Pulse: 103  Temp: 98.6 F (37 C)   Multiple attempts to contact the neuro hospitalist unsuccessful.   I have spoken  with [facility] who will see and admit patient.    MDM  Weakness, likely MS flare. CT neg. Admit for steroids, management of blood sugars.         Renne Crigler, [place] 04/15/12 [phone]

## 2012-04-15 NOTE — H&P (Signed)
Physician Admission History and Physical     PCP:   Mario Fillers, MD   Chief Complaint:  R facial droop, worsening gait, difficulty w/ speech   HPI: Mario Shields is an 52 y.o. male.  Pt is a 52 yo w/ hx of MS that is not on chronic treatment and apparently has not seen a neurologist in several years who presents w/ symptoms similar to prior MS exacerbations. CT head shows chronic white matter changes in his frontal regions, L > R. CXR showed no infection. Did have some leukocytosis, but no source of infection. (no cough, abd pain, dysuria, congestion, diarrhea, emesis.) Dr Mario Shields w/ neuro hospitalist was consulted.   Gluc was noted to be 418 w/ some ketones in urine but CO2 22 and no AG. Hydrated and given 10U novolog and FSBS came down to 253   Review of Systems:  ROS negative except as noted above  Past Medical History: Past Medical History  Diagnosis Date  . MS (multiple sclerosis)   . Hypertension   . Diabetes mellitus without complication    History reviewed. No pertinent past surgical history.  Medications: Prior to Admission medications   Medication Sig Start Date End Date Taking? Authorizing Provider  insulin glargine (LANTUS) 100 UNIT/ML injection Inject 30 Units into the skin daily.   Yes Historical Provider, MD  Multiple Vitamin (MULTIVITAMIN WITH MINERALS) TABS Take 1 tablet by mouth daily.   Yes Historical Provider, MD  valsartan-hydrochlorothiazide (DIOVAN-HCT) 320-12.5 MG per tablet Take 1 tablet by mouth daily.   Yes Historical Provider, MD    Allergies:  No Known Allergies  Social History:  reports that he has been smoking Cigarettes.  He has been smoking about 1.00 pack per day. He does not have any smokeless tobacco history on file. He reports that he does not drink alcohol or use illicit drugs.  Family History: No family history on file.  Physical Exam: Filed Vitals:   04/15/12 1730 04/15/12 1745 04/15/12 1800 04/15/12 1815  BP: 121/83 137/99  125/87 130/81  Pulse: 75 87 81 67  Temp:      TempSrc:      Resp: 13 24 18 13   SpO2: 98% 97% 100% 100%   Gen: AAM in NAD  HEENT: impaired adduction of L eye, otherwise EOMI and PERRL. No icterus.  Cards: RRR, no MRG  Pulm: CTAB, no wheezes, rales , rhonchi  Abd: nontender, + BS, no masses  Ext: no clubbing cyanosis edema Neuro: R sided facial droop. Otherwise CN II-XII grossly intact. 5/5 strength in UE and LE. Normal reflexes.    Labs on Admission:   Recent Labs  04/15/12 1559  NA 136  K 4.2  CL 98  CO2 22  GLUCOSE 416*  BUN 11  CREATININE 0.76  CALCIUM 10.2    Recent Labs  04/15/12 1559  AST 23  ALT 25  ALKPHOS 93  BILITOT 0.3  PROT 7.8  ALBUMIN 4.4   No results found for this basename: LIPASE, AMYLASE,  in the last 72 hours  Recent Labs  04/15/12 1559  WBC 15.4*  NEUTROABS 9.9*  HGB 16.9  HCT 46.7  MCV 86.8  PLT 219   No results found for this basename: CKTOTAL, CKMB, CKMBINDEX, TROPONINI,  in the last 72 hours No results found for this basename: INR, PROTIME   No results found for this basename: TSH, T4TOTAL, FREET3, T3FREE, THYROIDAB,  in the last 72 hours No results found for this basename: VITAMINB12, FOLATE, FERRITIN,  TIBC, IRON, RETICCTPCT,  in the last 72 hours  Radiological Exams on Admission: Dg Chest 2 View  04/15/2012  *RADIOLOGY REPORT*  Clinical Data: Evaluate for pneumonia  CHEST - 2 VIEW  Comparison: 07/09/2009  Findings: Normal heart size.  There is no pleural effusion or edema.  No airspace consolidation.  Review of the visualized osseous structures is unremarkable.  IMPRESSION:  1.  No acute cardiopulmonary abnormalities.  2.  Low lung volumes.   Original Report Authenticated By: Signa Kell, M.D.    Ct Head Wo Contrast  04/15/2012  *RADIOLOGY REPORT*  Clinical Data: Weakness.  Slurred speech.  Right facial droop.  CT HEAD WITHOUT CONTRAST  Technique:  Contiguous axial images were obtained from the base of the skull through the  vertex without contrast.  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 There are chronic small vessel changes throughout the hemispheric white matter.  No sign of acute infarction, mass lesion, hemorrhage, hydrocephalus or extra-axial collection.  Sinuses are clear.  No calvarial abnormality.  IMPRESSION: No identifiable acute abnormality.  Chronic white matter changes most notable on the frontal regions left more than right.   Original Report Authenticated By: Paulina Fusi, M.D.    Orders placed during the hospital encounter of 04/15/12  . ED EKG  . ED EKG    Assessment/Plan MS   - consulted and spoke w/ Dr Mario Shields w/ neurohospitalists   - will follow their work up recs. Appreciate their recs.   - pt will likely need steroids and possibly MRI brain    DM   - cont home lantus   - NS at 125 cc/hr   - q4 hr SSI overnight to ensure stays out of severe hyperglycemia    HTN   - home meds   PPx   - lovenox and home PPI   Mario Shields 04/15/2012, 7:43 PM

## 2012-04-15 NOTE — ED Notes (Addendum)
Pt with hx of MS to ED c/o weakness x 1 weak an slurred speech and R facial droop since yesterday.  Family tried to have appt with pt's neurologist, but it was taking too long, so brought him here.  Stated last time pt exp MS exacerbation, s/s were similar without the R facial droop.  Pt also c/o L rib pain when falling after losing balance this am. Pt with R facial droop and slurred speech, but ao x 4.  CBG 418

## 2012-04-16 DIAGNOSIS — G35 Multiple sclerosis: Principal | ICD-10-CM

## 2012-04-16 LAB — CBC
HCT: 41.5 % (ref 39.0–52.0)
Hemoglobin: 15 g/dL (ref 13.0–17.0)
MCH: 31.2 pg (ref 26.0–34.0)
MCHC: 36.1 g/dL — ABNORMAL HIGH (ref 30.0–36.0)
RBC: 4.81 MIL/uL (ref 4.22–5.81)

## 2012-04-16 LAB — BASIC METABOLIC PANEL
CO2: 24 mEq/L (ref 19–32)
GFR calc non Af Amer: >90 mL/min (ref >90)
Glucose, Bld: 234 mg/dL — ABNORMAL HIGH (ref 70–99)
Potassium: 4 mEq/L (ref 3.5–5.1)
Sodium: 139 mEq/L (ref 135–145)

## 2012-04-16 LAB — GLUCOSE, CAPILLARY
Glucose-Capillary: 222 mg/dL — ABNORMAL HIGH (ref 70–99)
Glucose-Capillary: 271 mg/dL — ABNORMAL HIGH (ref 70–99)
Glucose-Capillary: 216 mg/dL — ABNORMAL HIGH (ref 70–99)

## 2012-04-16 MED ORDER — INSULIN GLARGINE 100 UNIT/ML ~~LOC~~ SOLN
40.0000 [IU] | Freq: Every day | SUBCUTANEOUS | Status: DC
  Administered 2012-04-17: 40 [IU] via SUBCUTANEOUS

## 2012-04-16 MED ORDER — INSULIN ASPART 100 UNIT/ML ~~LOC~~ SOLN
20.0000 [IU] | Freq: Once | SUBCUTANEOUS | Status: AC
  Administered 2012-04-16: 20 [IU] via SUBCUTANEOUS

## 2012-04-16 NOTE — Progress Notes (Signed)
Subjective: Right sided weakness is improving, not having problems swallowing, unsteady with walking  Objective: Vital signs in last 24 hours: Temp:  [97.8 F (36.6 C)-98.6 F (37 C)] 97.8 F (36.6 C) (02/18 0647) Pulse Rate:  [67-103] 68 (02/18 0647) Resp:  [13-24] 16 (02/18 0647) BP: (105-137)/(63-99) 105/63 mmHg (02/18 0647) SpO2:  [95 %-100 %] 98 % (02/18 0647) Weight change:    Intake/Output from previous day: 02/17 0701 - 02/18 0700 In: -  Out: 400 [Urine:400]   General appearance: alert, cooperative and no distress Resp: clear to auscultation bilaterally Cardio: regular rate and rhythm, S1, S2 normal, no murmur, click, rub or gallop Extremities: extremities normal, atraumatic, no cyanosis or edema Neurologic: Mental status: Alert, oriented, thought content appropriate Cranial nerves: VII: lower facial muscle function mild right facial droop on the right Motor: 4/5 Right sided strength  Lab Results:  Recent Labs  04/15/12 1559 04/16/12 0455  WBC 15.4* 11.4*  HGB 16.9 15.0  HCT 46.7 41.5  PLT 219 195   BMET  Recent Labs  04/15/12 1559 04/16/12 0455  NA 136 139  K 4.2 4.0  CL 98 104  CO2 22 24  GLUCOSE 416* 234*  BUN 11 11  CREATININE 0.76 0.76  CALCIUM 10.2 9.4   CMET CMP     Component Value Date/Time   NA 139 04/16/2012 0455   K 4.0 04/16/2012 0455   CL 104 04/16/2012 0455   CO2 24 04/16/2012 0455   GLUCOSE 234* 04/16/2012 0455   BUN 11 04/16/2012 0455   CREATININE 0.76 04/16/2012 0455   CALCIUM 9.4 04/16/2012 0455   PROT 7.8 04/15/2012 1559   ALBUMIN 4.4 04/15/2012 1559   AST 23 04/15/2012 1559   ALT 25 04/15/2012 1559   ALKPHOS 93 04/15/2012 1559   BILITOT 0.3 04/15/2012 1559   GFRNONAA >90 04/16/2012 0455   GFRAA >90 04/16/2012 0455    CBG (last 3)   Recent Labs  04/15/12 2358 04/16/12 0421 04/16/12 0757  GLUCAP 305* 222* 271*    INR RESULTS:   No results found for this basename: INR, PROTIME     Studies/Results: Dg Chest 2  View  04/15/2012  *RADIOLOGY REPORT*  Clinical Data: Evaluate for pneumonia  CHEST - 2 VIEW  Comparison: 07/09/2009  Findings: Normal heart size.  There is no pleural effusion or edema.  No airspace consolidation.  Review of the visualized osseous structures is unremarkable.  IMPRESSION:  1.  No acute cardiopulmonary abnormalities.  2.  Low lung volumes.   Original Report Authenticated By: Signa Kell, M.D.    Ct Head Wo Contrast  04/15/2012  *RADIOLOGY REPORT*  Clinical Data: Weakness.  Slurred speech.  Right facial droop.  CT HEAD WITHOUT CONTRAST  Technique:  Contiguous axial images were obtained from the base of the skull through the vertex without contrast.  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 There are chronic small vessel changes throughout the hemispheric white matter.  No sign of acute infarction, mass lesion, hemorrhage, hydrocephalus or extra-axial collection.  Sinuses are clear.  No calvarial abnormality.  IMPRESSION: No identifiable acute abnormality.  Chronic white matter changes most notable on the frontal regions left more than right.   Original Report Authenticated By: Paulina Fusi, M.D.    Mr Laqueta Jean Wo Contrast  04/16/2012  *RADIOLOGY REPORT*  Clinical Data: Slurred speech, right facial weakness.  History of multiple sclerosis, hypertension, diabetes, smoking  MRI HEAD WITHOUT AND WITH CONTRAST  Technique:  Multiplanar, multiecho pulse sequences of the brain and surrounding structures were obtained according to standard protocol without and with intravenous contrast  Contrast: 15mL MULTIHANCE GADOBENATE DIMEGLUMINE 529 MG/ML IV SOLN  Comparison: CT 04/15/2012.  MRI head 08/27/2004  Findings: 10 x 11 mm hyperintensity left temporal parietal periventricular white matter shows mild restricted diffusion and enhances following contrast administration.  Enhancement pattern is relatively homogeneous and  solid enhancement.  No other areas of  abnormal enhancement or restricted diffusion are present.  This lesion could be due to subacute infarct or active demyelinization from multiple sclerosis.  Multiple areas of hyperintensity are  seen throughout the cerebral white matter extending into  the corpus callosum.  Most of  these are similar to the prior study in 2006.  There are small areas of hyperintensity in the posterior limb internal capsule bilaterally which were also present previously.  Brainstem and cerebellum are normal.  Negative for intracranial hemorrhage.  No fluid collection. Ventricle size is normal and there is no midline shift.  Paranasal sinuses are clear.  IMPRESSION: 10 x 11 mm hyperintensity left temporal parietal periventricular white matter.  This lesion shows restricted diffusion and enhancement.  I would favor this is an area of active demyelinization from multiple sclerosis.  Subacute infarction could also have this appearance.  Tumor is not considered likely   given the pattern of the white matter lesions.  Cerebral white matter disease extending into the   internal capsule bilaterally is similar to 2006.  This is most likely due to multiple sclerosis however chronic microvascular ischemia could have a similar appearance.   Original Report Authenticated By: Janeece Riggers, M.D.     Medications: I have reviewed the patient's current medications.  Assessment/Plan: #1 Right Facial Droop: appears due to MS exacerbation and improved with IV steroids. Will have PT and OT evaluation today, consider transition to po steroids soon. #2 DM-1: blood sugars high due to steroids. Will increase insulin rx.   LOS: 1 day   Gurkaran Rahm G 04/16/2012, 10:16 AM

## 2012-04-16 NOTE — ED Provider Notes (Signed)
Medical screening examination/treatment/procedure(s) were conducted as a shared visit with non-physician practitioner(s) and myself.  I personally evaluated the patient during the encounter  Pasqual Farias T Demicco, MD 04/16/12 1519 

## 2012-04-16 NOTE — Progress Notes (Signed)
CBG now is 216.

## 2012-04-16 NOTE — Progress Notes (Addendum)
Inpatient Diabetes Program Recommendations  AACE/ADA: New Consensus Statement on Inpatient Glycemic Control (2013)  Target Ranges:  Prepandial:   less than 140 mg/dL      Peak postprandial:   less than 180 mg/dL (1-2 hours)      Critically ill patients:  140 - 180 mg/dL   Inpatient Diabetes Program Recommendations Correction (SSI): Increase to RESISTANT during steroid therapy Note Lantus is ordered once daily but patient has received two doses in less than 24 hours and CBGs still remain elevated.  CBG at noon = 341. Order A1C to assess prehospital glucose control. Thank you  Piedad Climes BSN, RN,CDE Inpatient Diabetes Coordinator [phone] (team pager)

## 2012-04-16 NOTE — Progress Notes (Signed)
Subjective: Patient s/p 2 Solumedrol doses.  Blood sugars being monitored.  Patient reports no significant changes.    Objective: Current vital signs: BP 105/63  Pulse 68  Temp(Src) 97.8 F (36.6 C) (Oral)  Resp 16  SpO2 98% Vital signs in last 24 hours: Temp:  [97.8 F (36.6 C)-98.6 F (37 C)] 97.8 F (36.6 C) (02/18 0647) Pulse Rate:  [67-103] 68 (02/18 0647) Resp:  [13-24] 16 (02/18 0647) BP: (105-137)/(63-99) 105/63 mmHg (02/18 0647) SpO2:  [95 %-100 %] 98 % (02/18 0647)  Intake/Output from previous day: 02/17 0701 - 02/18 0700 In: -  Out: 400 [Urine:400] Intake/Output this shift: Total I/O In: -  Out: 400 [Urine:400] Nutritional status: Carb Control  Neurologic Exam: Mental Status:  Alert, awake, oriented x 4.  Speech fluent..  Dysarthric.  Cranial Nerves:  II: Discs flat bilaterally; Visual fields grossly normal, pupils equal, round, reactive to light and accommodation  III,IV, VI: ptosis not present, extra-ocular motions intact bilaterally  V: facial light touch sensation normal bilaterally.  VII: right facial droop  VIII: hearing normal bilaterally  IX,X: gag reflex present  XI: bilateral shoulder shrug  XII: midline tongue extension  Motor:  5/5 upper extremities bilaterally with mild RUE drift.   3+/5 in BLE's Tone and bulk:normal tone throughout; no atrophy noted.  Sensory: Pinprick and light touch intact throughout, bilaterally  Deep Tendon Reflexes: generalized hyperreflexia without clonus.  Plantars:  Right: upgoing     Left: downgoing  Cerebellar:  Mild bilateral intention tremor (L>R).   Lab Results: Basic Metabolic Panel:  Recent Labs Lab 04/15/12 1559 04/16/12 0455  NA 136 139  K 4.2 4.0  CL 98 104  CO2 22 24  GLUCOSE 416* 234*  BUN 11 11  CREATININE 0.76 0.76  CALCIUM 10.2 9.4    Liver Function Tests:  Recent Labs Lab 04/15/12 1559  AST 23  ALT 25  ALKPHOS 93  BILITOT 0.3  PROT 7.8  ALBUMIN 4.4   No results found  for this basename: LIPASE, AMYLASE,  in the last 168 hours No results found for this basename: AMMONIA,  in the last 168 hours  CBC:  Recent Labs Lab 04/15/12 1559 04/16/12 0455  WBC 15.4* 11.4*  NEUTROABS 9.9*  --   HGB 16.9 15.0  HCT 46.7 41.5  MCV 86.8 86.3  PLT 219 195    Cardiac Enzymes: No results found for this basename: CKTOTAL, CKMB, CKMBINDEX, TROPONINI,  in the last 168 hours  Lipid Panel: No results found for this basename: CHOL, TRIG, HDL, CHOLHDL, VLDL, LDLCALC,  in the last 168 hours  CBG:  Recent Labs Lab 04/15/12 2120 04/15/12 2358 04/16/12 0421 04/16/12 0757 04/16/12 1215  GLUCAP 211* 305* 222* 271* 341*    Microbiology: No results found for this or any previous visit.  Coagulation Studies: No results found for this basename: LABPROT, INR,  in the last 72 hours  Imaging: Dg Chest 2 View  04/15/2012  *RADIOLOGY REPORT*  Clinical Data: Evaluate for pneumonia  CHEST - 2 VIEW  Comparison: 07/09/2009  Findings: Normal heart size.  There is no pleural effusion or edema.  No airspace consolidation.  Review of the visualized osseous structures is unremarkable.  IMPRESSION:  1.  No acute cardiopulmonary abnormalities.  2.  Low lung volumes.   Original Report Authenticated By: Signa Kell, M.D.    Ct Head Wo Contrast  04/15/2012  *RADIOLOGY REPORT*  Clinical Data: Weakness.  Slurred speech.  Right facial droop.  CT HEAD  WITHOUT CONTRAST  Technique:  Contiguous axial images were obtained from the base of the skull through the vertex without contrast.  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 There are chronic small vessel changes throughout the hemispheric white matter.  No sign of acute infarction, mass lesion, hemorrhage, hydrocephalus or extra-axial collection.  Sinuses are clear.  No calvarial abnormality.  IMPRESSION: No identifiable acute abnormality.  Chronic white matter changes most notable on the  frontal regions left more than right.   Original Report Authenticated By: Paulina Fusi, M.D.    Mr Laqueta Jean Wo Contrast  04/16/2012  *RADIOLOGY REPORT*  Clinical Data: Slurred speech, right facial weakness.  History of multiple sclerosis, hypertension, diabetes, smoking  MRI HEAD WITHOUT AND WITH CONTRAST  Technique:  Multiplanar, multiecho pulse sequences of the brain and surrounding structures were obtained according to standard protocol without and with intravenous contrast  Contrast: 15mL MULTIHANCE GADOBENATE DIMEGLUMINE 529 MG/ML IV SOLN  Comparison: CT 04/15/2012.  MRI head 08/27/2004  Findings: 10 x 11 mm hyperintensity left temporal parietal periventricular white matter shows mild restricted diffusion and enhances following contrast administration.  Enhancement pattern is relatively homogeneous and  solid enhancement.  No other areas of abnormal enhancement or restricted diffusion are present.  This lesion could be due to subacute infarct or active demyelinization from multiple sclerosis.  Multiple areas of hyperintensity are  seen throughout the cerebral white matter extending into  the corpus callosum.  Most of  these are similar to the prior study in 2006.  There are small areas of hyperintensity in the posterior limb internal capsule bilaterally which were also present previously.  Brainstem and cerebellum are normal.  Negative for intracranial hemorrhage.  No fluid collection. Ventricle size is normal and there is no midline shift.  Paranasal sinuses are clear.  IMPRESSION: 10 x 11 mm hyperintensity left temporal parietal periventricular white matter.  This lesion shows restricted diffusion and enhancement.  I would favor this is an area of active demyelinization from multiple sclerosis.  Subacute infarction could also have this appearance.  Tumor is not considered likely   given the pattern of the white matter lesions.  Cerebral white matter disease extending into the   internal capsule bilaterally  is similar to 2006.  This is most likely due to multiple sclerosis however chronic microvascular ischemia could have a similar appearance.   Original Report Authenticated By: Janeece Riggers, M.D.     Medications:  I have reviewed the patient's current medications. Scheduled: . enoxaparin (LOVENOX) injection  40 mg Subcutaneous Q24H  . irbesartan  300 mg Oral Daily   And  . hydrochlorothiazide  12.5 mg Oral Daily  . insulin aspart  0-15 Units Subcutaneous Q4H  . [START ON 04/17/2012] insulin glargine  40 Units Subcutaneous Daily  . methylPREDNISolone (SOLU-MEDROL) injection  1,000 mg Intravenous Daily  . multivitamin with minerals  1 tablet Oral Daily  . pantoprazole  40 mg Oral Q0600    Assessment/Plan: Patient with onset of dysarthria, BLE weakness and difficulty with gait.  MRI has been reviewed and is consistent with an MS exacerbation.  Patient started on Solmedrol-s/p 2 doses.  Patient on sliding scale.    Recommendations:  1.  Patient scheduled for 5 days of Solumedrol.      LOS: 1 day   Thana Farr, MD [facility] [phone] 04/16/2012  1:28 PM

## 2012-04-16 NOTE — Evaluation (Signed)
Physical Therapy Evaluation Patient Details Name: Mario Shields MRN: [id number] DOB: 04/30/1960 Today's Date: 04/16/2012 Time: 4098-1191 PT Time Calculation (min): 27 min  PT Assessment / Plan / Recommendation Clinical Impression  Pt is a pleasent 52 y.o. male with exacerbations of MS likely. Pt demonstrates deficits in functional mobility as well as other current impairments (slurred speach, visual coordination difficultys).  Patient will benefit from continued skilledPT to address deficits and maximize mobility and function.  Pt does have good family support as confirmed by nephew at bedside.     PT Assessment  Patient needs continued PT services    Follow Up Recommendations  Home health PT;Supervision/Assistance - 24 hour          Equipment Recommendations  Rolling walker with 5" wheels    Recommendations for Other Services OT consult   Frequency Min 3X/week    Precautions / Restrictions     Pertinent Vitals/Pain No pain generally, some discomfort at IV site      Mobility  Bed Mobility Bed Mobility: Supine to Sit;Sitting - Scoot to Edge of Bed;Sit to Supine Supine to Sit: 5: Supervision Sitting - Scoot to Edge of Bed: 5: Supervision Sit to Supine: 5: Supervision Transfers Transfers: Sit to Stand;Stand to Sit Sit to Stand: 4: Min guard;From bed;From toilet Stand to Sit: 4: Min guard;To bed;To toilet Details for Transfer Assistance: VC's for hand placement Ambulation/Gait Ambulation/Gait Assistance: 3: Mod assist Ambulation Distance (Feet): 24 Feet Assistive device: 1 person hand held assist (IV pole ) Ambulation/Gait Assistance Details: Patient with increased instability and dyscoordination with ambulation. Patient with extensor tone exhibited as well. Rec use of rw Gait Pattern: Step-through pattern;Decreased stride length;Decreased dorsiflexion - right;Decreased dorsiflexion - left;Narrow base of support Gait velocity: decreased General Gait Details: pt with  increase use of furniture vs IV pole on unassisted side Stairs: No        PT Diagnosis: Difficulty walking;Abnormality of gait;Generalized weakness  PT Problem List: Decreased strength;Decreased range of motion;Decreased activity tolerance;Decreased balance;Decreased mobility;Decreased coordination PT Treatment Interventions: DME instruction;Gait training;Stair training;Functional mobility training;Therapeutic activities;Therapeutic exercise   PT Goals Acute Rehab PT Goals PT Goal Formulation: With patient Time For Goal Achievement: 04/23/12 Potential to Achieve Goals: Good Pt will go Supine/Side to Sit: Independently PT Goal: Supine/Side to Sit - Progress: Goal set today Pt will go Sit to Supine/Side: Independently PT Goal: Sit to Supine/Side - Progress: Goal set today Pt will go Sit to Stand: with modified independence PT Goal: Sit to Stand - Progress: Goal set today Pt will go Stand to Sit: with modified independence PT Goal: Stand to Sit - Progress: Goal set today Pt will Ambulate: >150 feet;with modified independence;with rolling walker PT Goal: Ambulate - Progress: Goal set today Pt will Go Up / Down Stairs: Flight;with min assist PT Goal: Up/Down Stairs - Progress: Goal set today  Visit Information  Last PT Received On: 04/16/12 Assistance Needed: +1    Subjective Data  Subjective: "I feel like it's getting better"   Prior Functioning  Home Living Lives With: Family;Other (Comment) (mother and brother) Available Help at Discharge: Family;Available 24 hours/day Type of Home: House Home Access: Ramped entrance Home Layout: Two level;Other (Comment) (bedroom down stairs) Alternate Level Stairs-Number of Steps:  (full flight) Alternate Level Stairs-Rails: Right;Left Bathroom Shower/Tub: Health visitor: Standard Home Adaptive Equipment: Bedside commode/3-in-1;Shower chair without back;Straight cane Prior Function Level of Independence: Independent with  assistive device(s) (cane) Able to Take Stairs?: Yes Driving: Yes (has not in past  3 weeks) Vocation: On disability Communication Communication: No difficulties Dominant Hand: Right    Cognition  Cognition Overall Cognitive Status: Appears within functional limits for tasks assessed/performed Arousal/Alertness: Awake/alert Orientation Level: Appears intact for tasks assessed;Oriented X4 / Intact Behavior During Session: [facility] for tasks performed    Extremity/Trunk Assessment Right Upper Extremity Assessment RUE ROM/Strength/Tone: [facility] for tasks assessed Left Upper Extremity Assessment LUE ROM/Strength/Tone: WFL for tasks assessed Right Lower Extremity Assessment RLE ROM/Strength/Tone: Deficits RLE ROM/Strength/Tone Deficits: generalizes weakness 3+/5 (increased extension tone with ambulation) Left Lower Extremity Assessment LLE ROM/Strength/Tone: Deficits LLE ROM/Strength/Tone Deficits: generalizes weakness 3+/5 (increased extension tone with ambulation)   Balance Balance Balance Assessed: Yes Dynamic Standing Balance Dynamic Standing - Balance Support: Left upper extremity supported Dynamic Standing - Level of Assistance: 3: Mod assist Dynamic Standing - Comments: 3 minutes  End of Session PT - End of Session Equipment Utilized During Treatment: Gait belt Activity Tolerance: Patient limited by fatigue Patient left: in bed;with call bell/phone within reach;with family/visitor present Nurse Communication: Mobility status;Other (comment) (need for use of rw and gait belt)  GP     Fabio Asa 04/16/2012, 10:52 AM Charlotte Crumb, PT DPT  [phone]

## 2012-04-16 NOTE — Progress Notes (Signed)
CBG was 405, on call MD notified and new order given.

## 2012-04-17 LAB — BASIC METABOLIC PANEL
BUN: 13 mg/dL (ref 6–23)
CO2: 24 mEq/L (ref 19–32)
Calcium: 9.3 mg/dL (ref 8.4–10.5)
Creatinine, Ser: 0.68 mg/dL (ref 0.50–1.35)

## 2012-04-17 LAB — CBC
HCT: 36 % — ABNORMAL LOW (ref 39.0–52.0)
MCH: 30.6 pg (ref 26.0–34.0)
MCHC: 35.8 g/dL (ref 30.0–36.0)
MCV: 85.3 fL (ref 78.0–100.0)
Platelets: 171 10*3/uL (ref 150–400)
RDW: 13.1 % (ref 11.5–15.5)
WBC: 20.3 10*3/uL — ABNORMAL HIGH (ref 4.0–10.5)

## 2012-04-17 LAB — GLUCOSE, CAPILLARY
Glucose-Capillary: 182 mg/dL — ABNORMAL HIGH (ref 70–99)
Glucose-Capillary: 184 mg/dL — ABNORMAL HIGH (ref 70–99)
Glucose-Capillary: 222 mg/dL — ABNORMAL HIGH (ref 70–99)
Glucose-Capillary: 225 mg/dL — ABNORMAL HIGH (ref 70–99)
Glucose-Capillary: 360 mg/dL — ABNORMAL HIGH (ref 70–99)
Glucose-Capillary: 385 mg/dL — ABNORMAL HIGH (ref 70–99)

## 2012-04-17 MED ORDER — INSULIN GLARGINE 100 UNIT/ML ~~LOC~~ SOLN
40.0000 [IU] | Freq: Two times a day (BID) | SUBCUTANEOUS | Status: DC
  Administered 2012-04-17 – 2012-04-19 (×4): 40 [IU] via SUBCUTANEOUS

## 2012-04-17 NOTE — Progress Notes (Signed)
Physical Therapy Treatment Patient Details Name: Mario Shields MRN: [id number] DOB: 17-Jun-1960 Today's Date: 04/17/2012 Time: 4098-1191 PT Time Calculation (min): 23 min  PT Assessment / Plan / Recommendation Comments on Treatment Session  Pt adm with MS exacerbation.  Pt with improved gait with use of rolling walker.  Will need rolling walker for home. Recommend OT consult.    Follow Up Recommendations  Home health PT;Supervision/Assistance - 24 hour     Does the patient have the potential to tolerate intense rehabilitation     Barriers to Discharge        Equipment Recommendations  Rolling walker with 5" wheels    Recommendations for Other Services OT consult  Frequency Min 3X/week   Plan Discharge plan remains appropriate;Frequency remains appropriate    Precautions / Restrictions Precautions Precautions: Fall Precaution Comments: Reiterated to pt not to get up without assistance. Pt verbalized understanding.   Pertinent Vitals/Pain N/A    Mobility  Bed Mobility Supine to Sit: 6: Modified independent (Device/Increase time);HOB elevated Sitting - Scoot to Edge of Bed: 6: Modified independent (Device/Increase time) Transfers Sit to Stand: 4: Min guard;4: Min assist;With upper extremity assist;From bed;Other (comment);With armrests;From chair/3-in-1 (bench in hallway) Stand to Sit: 4: Min guard;With upper extremity assist;With armrests;To chair/3-in-1;Other (comment) (bench in hallway) Details for Transfer Assistance: Verbal cues for hand placement. Min A from low bench but min guard from bed or chair. Ambulation/Gait Ambulation/Gait Assistance: 4: Min assist Ambulation Distance (Feet): 200 Feet Assistive device: Rolling walker Ambulation/Gait Assistance Details: verbal cues to push rolling walker and not pick it up. Gait Pattern: Step-through pattern;Decreased stride length;Decreased dorsiflexion - right;Decreased dorsiflexion - left;Narrow base of support Gait velocity:  decreased General Gait Details: Improved stability with use of rolling walker    Exercises     PT Diagnosis:    PT Problem List:   PT Treatment Interventions:     PT Goals Acute Rehab PT Goals PT Goal: Supine/Side to Sit - Progress: Progressing toward goal PT Goal: Sit to Stand - Progress: Progressing toward goal PT Goal: Stand to Sit - Progress: Progressing toward goal PT Goal: Ambulate - Progress: Progressing toward goal  Visit Information  Last PT Received On: 04/17/12 Assistance Needed: +1    Subjective Data  Subjective: "It feels good to get out of the room," pt stated when walking in the hall.   Cognition  Cognition Overall Cognitive Status: Appears within functional limits for tasks assessed/performed Arousal/Alertness: Awake/alert Orientation Level: Appears intact for tasks assessed;Oriented X4 / Intact Behavior During Session: [facility] for tasks performed    Balance  Balance Balance Assessed: Yes Static Standing Balance Static Standing - Balance Support: Bilateral upper extremity supported (on walker) Static Standing - Level of Assistance: 5: Stand by assistance  End of Session PT - End of Session Equipment Utilized During Treatment: Gait belt Activity Tolerance: Patient tolerated treatment well Patient left: in chair;with call bell/phone within reach Nurse Communication: Mobility status   GP     [facility] 04/17/2012, 10:28 AM  Skip Mayer PT [phone]

## 2012-04-17 NOTE — Progress Notes (Addendum)
NEURO HOSPITALIST PROGRESS NOTE   SUBJECTIVE:                                                                                                                         Patient still having some dysarthria and some weakness on the right side.  NO adverse effects to steroids.   OBJECTIVE:                                                                                                                           Vital signs in last 24 hours: Temp:  [98 F (36.7 C)-98.4 F (36.9 C)] 98.1 F (36.7 C) (02/19 0609) Pulse Rate:  [55-73] 55 (02/19 0609) Resp:  [16-18] 16 (02/19 0609) BP: (120-128)/(67-83) 122/67 mmHg (02/19 0609) SpO2:  [98 %-100 %] 98 % (02/19 0609) Weight:  [79.6 kg (175 lb 7.8 oz)] 79.6 kg (175 lb 7.8 oz) (02/19 0500)  Intake/Output from previous day: 02/18 0701 - 02/19 0700 In: 3071 [P.O.:960; I.V.:2111] Out: 2455 [Urine:2455] Intake/Output this shift: Total I/O In: 240 [P.O.:240] Out: -  Nutritional status: Carb Control  Past Medical History  Diagnosis Date  . MS (multiple sclerosis)   . Hypertension   . Diabetes mellitus without complication      Neurologic Exam:  Mental Status: Alert, oriented, thought content appropriate.  Speechdysarthric without evidence of aphasia.  Able to follow 3 step commands without difficulty. Cranial Nerves: II:  Visual fields grossly normal, pupils equal, round, reactive to light and accommodation III,IV, VI: ptosis not present, extra-ocular motions shows a right INO with nystagmus in right eye when abducted and decreased ability to ADDuct his left eye.  V,VII: smile asymmetric with decreased right facial elevation, facial light touch sensation normal bilaterally VIII: hearing normal bilaterally IX,X: gag reflex present XI: bilateral shoulder shrug XII: midline tongue extension Motor: Right : Upper extremity   4/5    Left:     Upper extremity   5/5  Lower extremity   4/5     Lower extremity    5/5 --right pronator drift Tone and bulk:normal tone throughout; no atrophy noted Sensory: Pinprick and light touch intact throughout, bilaterally Deep Tendon Reflexes: 2+ and symmetric throughout Plantars: Right: downgoing   Left: downgoing  Cerebellar: normal finger-to-nose shows dysmetria bilaterally  CV: pulses palpable throughout    Lab Results: No results found for this basename: cbc, bmp, coags, chol, tri, ldl, hga1c   Lipid Panel No results found for this basename: CHOL, TRIG, HDL, CHOLHDL, VLDL, LDLCALC,  in the last 72 hours  Studies/Results: Dg Chest 2 View  04/15/2012  *RADIOLOGY REPORT*  Clinical Data: Evaluate for pneumonia  CHEST - 2 VIEW  Comparison: 07/09/2009  Findings: Normal heart size.  There is no pleural effusion or edema.  No airspace consolidation.  Review of the visualized osseous structures is unremarkable.  IMPRESSION:  1.  No acute cardiopulmonary abnormalities.  2.  Low lung volumes.   Original Report Authenticated By: Signa Kell, M.D.    Ct Head Wo Contrast  04/15/2012  *RADIOLOGY REPORT*  Clinical Data: Weakness.  Slurred speech.  Right facial droop.  CT HEAD WITHOUT CONTRAST  Technique:  Contiguous axial images were obtained from the base of the skull through the vertex without contrast.  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 There are chronic small vessel changes throughout the hemispheric white matter.  No sign of acute infarction, mass lesion, hemorrhage, hydrocephalus or extra-axial collection.  Sinuses are clear.  No calvarial abnormality.  IMPRESSION: No identifiable acute abnormality.  Chronic white matter changes most notable on the frontal regions left more than right.   Original Report Authenticated By: Paulina Fusi, M.D.    Mr Laqueta Jean Wo Contrast  04/16/2012  *RADIOLOGY REPORT*  Clinical Data: Slurred speech, right facial weakness.  History of multiple sclerosis, hypertension, diabetes,  smoking  MRI HEAD WITHOUT AND WITH CONTRAST  Technique:  Multiplanar, multiecho pulse sequences of the brain and surrounding structures were obtained according to standard protocol without and with intravenous contrast  Contrast: 15mL MULTIHANCE GADOBENATE DIMEGLUMINE 529 MG/ML IV SOLN  Comparison: CT 04/15/2012.  MRI head 08/27/2004  Findings: 10 x 11 mm hyperintensity left temporal parietal periventricular white matter shows mild restricted diffusion and enhances following contrast administration.  Enhancement pattern is relatively homogeneous and  solid enhancement.  No other areas of abnormal enhancement or restricted diffusion are present.  This lesion could be due to subacute infarct or active demyelinization from multiple sclerosis.  Multiple areas of hyperintensity are  seen throughout the cerebral white matter extending into  the corpus callosum.  Most of  these are similar to the prior study in 2006.  There are small areas of hyperintensity in the posterior limb internal capsule bilaterally which were also present previously.  Brainstem and cerebellum are normal.  Negative for intracranial hemorrhage.  No fluid collection. Ventricle size is normal and there is no midline shift.  Paranasal sinuses are clear.  IMPRESSION: 10 x 11 mm hyperintensity left temporal parietal periventricular white matter.  This lesion shows restricted diffusion and enhancement.  I would favor this is an area of active demyelinization from multiple sclerosis.  Subacute infarction could also have this appearance.  Tumor is not considered likely   given the pattern of the white matter lesions.  Cerebral white matter disease extending into the   internal capsule bilaterally is similar to 2006.  This is most likely due to multiple sclerosis however chronic microvascular ischemia could have a similar appearance.   Original Report Authenticated By: Janeece Riggers, M.D.     MEDICATIONS  Scheduled: . enoxaparin (LOVENOX) injection  40 mg Subcutaneous Q24H  . irbesartan  300 mg Oral Daily   And  . hydrochlorothiazide  12.5 mg Oral Daily  . insulin aspart  0-15 Units Subcutaneous Q4H  . insulin glargine  40 Units Subcutaneous Daily  . methylPREDNISolone (SOLU-MEDROL) injection  1,000 mg Intravenous Daily  . multivitamin with minerals  1 tablet Oral Daily  . pantoprazole  40 mg Oral Q0600    ASSESSMENT/PLAN:                                                                                                           MS exacerbation: Patient has received 2/5 doses methylprednisolone.  He is feeling some improvement but continues to show a right INO, dysmetria bilateral UE and decreased strength on right arm and leg.   Recommend: 1) Continue to with methylprednisolone for 3 more treatments 2) I have Called [facility] and spoke with Andrey Campanile RN--I have given her his information to obtain a follow up with Dr. Anne Hahn.  She has told me they will call him and make appointment.  I attempted to obtain a date and time at that time but was rerouted to answering service and was unable to get in contact with nursing staff after multiple attempts.    Assessment and plan discussed with with attending physician and they are in agreement.    Felicie Morn PA-C [facility] [phone]  04/17/2012, 10:38 AM   I have seen and evaluated the patient. I have reviewed the above note and made appropriate changes. Right hemiparesis, left dysmetria and INO on exam. He will need 5 days total solumedrol and will need immunomodulating therapy long term, but this will need to be started as an outpatient. PT,OT.  Ritta Slot, MD [facility] [phone]  If 7pm- 7am, please page neurology on call at [phone].

## 2012-04-17 NOTE — Progress Notes (Signed)
Subjective: Strength along the right side of his body is slowly improving. He is able to consume all foods and liquids without coughing. He denies headache, nausea, or insomnia. Objective: Vital signs in last 24 hours: Temp:  [98 F (36.7 C)-98.4 F (36.9 C)] 98 F (36.7 C) (02/19 1326) Pulse Rate:  [55-71] 69 (02/19 1326) Resp:  [16-18] 18 (02/19 1326) BP: (120-128)/(67-71) 128/67 mmHg (02/19 1326) SpO2:  [97 %-100 %] 97 % (02/19 1326) Weight:  [79.6 kg (175 lb 7.8 oz)] 79.6 kg (175 lb 7.8 oz) (02/19 0500) Weight change:    Intake/Output from previous day: 02/18 0701 - 02/19 0700 In: 3071 [P.O.:960; I.V.:2111] Out: 2455 [Urine:2455]   General appearance: alert, cooperative and no distress Head: Normocephalic, without obvious abnormality, atraumatic, minimal right facial droop Resp: clear to auscultation bilaterally Cardio: regular rate and rhythm, S1, S2 normal, no murmur, click, rub or gallop He was alert and well oriented with a normal affect, he continues to have a mild right facial droop with mild dysarthria.  Lab Results:  Recent Labs  04/16/12 0455 04/17/12 0447  WBC 11.4* 20.3*  HGB 15.0 12.9*  HCT 41.5 36.0*  PLT 195 171   BMET  Recent Labs  04/16/12 0455 04/17/12 0447  NA 139 139  K 4.0 3.4*  CL 104 107  CO2 24 24  GLUCOSE 234* 248*  BUN 11 13  CREATININE 0.76 0.68  CALCIUM 9.4 9.3   CMET CMP     Component Value Date/Time   NA 139 04/17/2012 0447   K 3.4* 04/17/2012 0447   CL 107 04/17/2012 0447   CO2 24 04/17/2012 0447   GLUCOSE 248* 04/17/2012 0447   BUN 13 04/17/2012 0447   CREATININE 0.68 04/17/2012 0447   CALCIUM 9.3 04/17/2012 0447   PROT 7.8 04/15/2012 1559   ALBUMIN 4.4 04/15/2012 1559   AST 23 04/15/2012 1559   ALT 25 04/15/2012 1559   ALKPHOS 93 04/15/2012 1559   BILITOT 0.3 04/15/2012 1559   GFRNONAA >90 04/17/2012 0447   GFRAA >90 04/17/2012 0447    CBG (last 3)   Recent Labs  04/17/12 0751 04/17/12 1158 04/17/12 1547  GLUCAP  182* 222* 385*    INR RESULTS:   No results found for this basename: INR, PROTIME     Studies/Results: Mr Laqueta Jean Wo Contrast  04/16/2012  *RADIOLOGY REPORT*  Clinical Data: Slurred speech, right facial weakness.  History of multiple sclerosis, hypertension, diabetes, smoking  MRI HEAD WITHOUT AND WITH CONTRAST  Technique:  Multiplanar, multiecho pulse sequences of the brain and surrounding structures were obtained according to standard protocol without and with intravenous contrast  Contrast: 15mL MULTIHANCE GADOBENATE DIMEGLUMINE 529 MG/ML IV SOLN  Comparison: CT 04/15/2012.  MRI head 08/27/2004  Findings: 10 x 11 mm hyperintensity left temporal parietal periventricular white matter shows mild restricted diffusion and enhances following contrast administration.  Enhancement pattern is relatively homogeneous and  solid enhancement.  No other areas of abnormal enhancement or restricted diffusion are present.  This lesion could be due to subacute infarct or active demyelinization from multiple sclerosis.  Multiple areas of hyperintensity are  seen throughout the cerebral white matter extending into  the corpus callosum.  Most of  these are similar to the prior study in 2006.  There are small areas of hyperintensity in the posterior limb internal capsule bilaterally which were also present previously.  Brainstem and cerebellum are normal.  Negative for intracranial hemorrhage.  No fluid collection. Ventricle size is normal  and there is no midline shift.  Paranasal sinuses are clear.  IMPRESSION: 10 x 11 mm hyperintensity left temporal parietal periventricular white matter.  This lesion shows restricted diffusion and enhancement.  I would favor this is an area of active demyelinization from multiple sclerosis.  Subacute infarction could also have this appearance.  Tumor is not considered likely   given the pattern of the white matter lesions.  Cerebral white matter disease extending into the   internal  capsule bilaterally is similar to 2006.  This is most likely due to multiple sclerosis however chronic microvascular ischemia could have a similar appearance.   Original Report Authenticated By: Janeece Riggers, M.D.     Medications: I have reviewed the patient's current medications.  Assessment/Plan: #1 Right Hemiplegia: from MS flare and slowly improving with high dose steroids. #2 DM2: blood sugars remain elevated so insulin dosing will be increased  LOS: 2 days   Mario Shields 04/17/2012, 6:49 PM

## 2012-04-18 LAB — BASIC METABOLIC PANEL
Calcium: 8.8 mg/dL (ref 8.4–10.5)
GFR calc Af Amer: >90 mL/min (ref >90)
GFR calc non Af Amer: >90 mL/min (ref >90)
Potassium: 3.3 mEq/L — ABNORMAL LOW (ref 3.5–5.1)
Sodium: 143 mEq/L (ref 135–145)

## 2012-04-18 LAB — CBC
Hemoglobin: 12.3 g/dL — ABNORMAL LOW (ref 13.0–17.0)
MCHC: 36.2 g/dL — ABNORMAL HIGH (ref 30.0–36.0)
RDW: 13.1 % (ref 11.5–15.5)
WBC: 15.4 10*3/uL — ABNORMAL HIGH (ref 4.0–10.5)

## 2012-04-18 LAB — GLUCOSE, CAPILLARY
Glucose-Capillary: 142 mg/dL — ABNORMAL HIGH (ref 70–99)
Glucose-Capillary: 281 mg/dL — ABNORMAL HIGH (ref 70–99)

## 2012-04-18 NOTE — Progress Notes (Signed)
Subjective: Dysarthria and right sided strength improving, walking with a walker  Objective: Vital signs in last 24 hours: Temp:  [97.7 F (36.5 C)-98.5 F (36.9 C)] 98.5 F (36.9 C) (02/20 1700) Pulse Rate:  [56-86] 86 (02/20 1700) Resp:  [18-20] 20 (02/20 1700) BP: (101-153)/(60-91) 122/65 mmHg (02/20 1700) SpO2:  [98 %-99 %] 98 % (02/20 1700) Weight change:    Intake/Output from previous day: 02/19 0701 - 02/20 0700 In: 3725.4 [P.O.:960; I.V.:2665.4; IV Piggyback:100] Out: 925 [Urine:925]   General appearance: alert, cooperative and no distress Resp: clear to auscultation bilaterally Cardio: regular rate and rhythm, S1, S2 normal, no murmur, click, rub or gallop He was alert and well oriented, has mild right facial droop and mild dysarthria  Lab Results:  Recent Labs  04/17/12 0447 04/18/12 0545  WBC 20.3* 15.4*  HGB 12.9* 12.3*  HCT 36.0* 34.0*  PLT 171 166   BMET  Recent Labs  04/17/12 0447 04/18/12 0545  NA 139 143  K 3.4* 3.3*  CL 107 110  CO2 24 25  GLUCOSE 248* 118*  BUN 13 13  CREATININE 0.68 0.65  CALCIUM 9.3 8.8   CMET CMP     Component Value Date/Time   NA 143 04/18/2012 0545   K 3.3* 04/18/2012 0545   CL 110 04/18/2012 0545   CO2 25 04/18/2012 0545   GLUCOSE 118* 04/18/2012 0545   BUN 13 04/18/2012 0545   CREATININE 0.65 04/18/2012 0545   CALCIUM 8.8 04/18/2012 0545   PROT 7.8 04/15/2012 1559   ALBUMIN 4.4 04/15/2012 1559   AST 23 04/15/2012 1559   ALT 25 04/15/2012 1559   ALKPHOS 93 04/15/2012 1559   BILITOT 0.3 04/15/2012 1559   GFRNONAA >90 04/18/2012 0545   GFRAA >90 04/18/2012 0545    CBG (last 3)   Recent Labs  04/18/12 1202 04/18/12 1612 04/18/12 1653  GLUCAP 281* 221* 216*    INR RESULTS:   No results found for this basename: INR, PROTIME     Studies/Results: No results found.  Medications: I have reviewed the patient's current medications.  Assessment/Plan: #1 Dysarthria: from flare of MS and improving. Will treat  with high dose steroids for 1 more day, then switch to interferon, will arrange for home health PT, OT upon discharge #2 DM2: sugars improved with high dose lantus and SSI. Will need to go back to home insulin dosing upon discharge  LOS: 3 days   Cortina Vultaggio G 04/18/2012, 7:21 PM

## 2012-04-18 NOTE — Progress Notes (Addendum)
NEURO HOSPITALIST PROGRESS NOTE   SUBJECTIVE:                                                                                                                        No subjective complaints. His vision is intermittently double.    OBJECTIVE:                                                                                                                           Vital signs in last 24 hours: Temp:  [97.7 F (36.5 C)-98.3 F (36.8 C)] 98 F (36.7 C) (02/20 0932) Pulse Rate:  [62-69] 66 (02/20 0932) Resp:  [18] 18 (02/20 0932) BP: (101-132)/(60-73) 119/73 mmHg (02/20 0932) SpO2:  [97 %-98 %] 98 % (02/20 0932)  Intake/Output from previous day: 02/19 0701 - 02/20 0700 In: 3725.4 [P.O.:960; I.V.:2665.4; IV Piggyback:100] Out: 925 [Urine:925] Intake/Output this shift: Total I/O In: -  Out: 300 [Urine:300] Nutritional status: Carb Control  Past Medical History  Diagnosis Date  . MS (multiple sclerosis)   . Hypertension   . Diabetes mellitus without complication     Neurologic ROS negative with exception of above. Musculoskeletal ROS none  Neurologic Exam:  Mental Status:  Alert, oriented, thought content appropriate. Speech dysarthric without evidence of aphasia. Able to follow 3 step commands without difficulty.  Cranial Nerves:  II: Visual fields grossly normal, pupils equal, round, reactive to light and accommodation  III,IV, VI: ptosis  Present right eye, extra-ocular motions shows a right and left  INO with nystagmus V,VII: smile asymmetric with decreased right facial elevation, facial light touch sensation normal bilaterally  VIII: hearing normal bilaterally  IX,X: gag reflex present  XI: bilateral shoulder shrug  XII: midline tongue extension  Motor:  Right : Upper extremity 4/5    Left:  Upper extremity 5/5   Lower extremity 4/5     Lower extremity 5/5  --right pronator drift  Tone and bulk:normal tone throughout; no atrophy  noted  Sensory: Pinprick and light touch intact throughout, bilaterally  Deep Tendon Reflexes: 2+ and symmetric throughout  Plantars:  Right: downgoing Left: downgoing  Cerebellar:  normal finger-to-nose shows dysmetria bilaterally  CV: pulses palpable throughout    Lab Results: No results found  for this basename: cbc, bmp, coags, chol, tri, ldl, hga1c   Lipid Panel No results found for this basename: CHOL, TRIG, HDL, CHOLHDL, VLDL, LDLCALC,  in the last 72 hours  Studies/Results: No results found.  MEDICATIONS                                                                                                                        Scheduled: . enoxaparin (LOVENOX) injection  40 mg Subcutaneous Q24H  . insulin aspart  0-15 Units Subcutaneous Q4H  . insulin glargine  40 Units Subcutaneous BID  . irbesartan  300 mg Oral Daily  . methylPREDNISolone (SOLU-MEDROL) injection  1,000 mg Intravenous Daily  . multivitamin with minerals  1 tablet Oral Daily  . pantoprazole  40 mg Oral Q0600    ASSESSMENT/PLAN:                                                                                                             MS exacerbation:  Patient has received 4/5 doses methylprednisolone. He is feeling some improvement but continues to show a bilateral INO (left more notable today), dysmetria bilateral UE and decreased strength on right arm and leg.  Recommend:  1) Continue to with methylprednisolone for 1 more treatment 2) Patient will need to follow up with Dr. Anne Hahn within 1-2 weeks of discharge 3) No need for steroid taper.    Assessment and plan discussed with with attending physician and they are in agreement.    Felicie Morn PA-C [facility] [phone]  04/18/2012, 10:57 AM   I have seen and evaluated the patient. I have reviewed the above note and made appropriate changes. Continues to have INO, bilateral but more prominent on right. Feels like he is making  improvement. Continue steroids, PT, OT.  Ritta Slot, MD [facility] [phone]  If 7pm- 7am, please page neurology on call at [phone].

## 2012-04-19 DIAGNOSIS — R2681 Unsteadiness on feet: Secondary | ICD-10-CM | POA: Diagnosis present

## 2012-04-19 DIAGNOSIS — D649 Anemia, unspecified: Secondary | ICD-10-CM | POA: Diagnosis not present

## 2012-04-19 LAB — CBC
HCT: 33.8 % — ABNORMAL LOW (ref 39.0–52.0)
Hemoglobin: 12.2 g/dL — ABNORMAL LOW (ref 13.0–17.0)
MCH: 30.8 pg (ref 26.0–34.0)
MCHC: 36.1 g/dL — ABNORMAL HIGH (ref 30.0–36.0)

## 2012-04-19 LAB — GLUCOSE, CAPILLARY: Glucose-Capillary: 188 mg/dL — ABNORMAL HIGH (ref 70–99)

## 2012-04-19 LAB — BASIC METABOLIC PANEL
BUN: 13 mg/dL (ref 6–23)
Chloride: 108 mEq/L (ref 96–112)
Glucose, Bld: 175 mg/dL — ABNORMAL HIGH (ref 70–99)
Potassium: 3.3 mEq/L — ABNORMAL LOW (ref 3.5–5.1)

## 2012-04-19 MED ORDER — INTERFERON BETA-1A 30 MCG/0.5ML IM KIT: 30.0000 ug | PACK | INTRAMUSCULAR | Status: DC

## 2012-04-19 MED ORDER — INSULIN GLARGINE 100 UNIT/ML ~~LOC~~ SOLN: 30.0000 [IU] | Freq: Every day | SUBCUTANEOUS | Status: AC

## 2012-04-19 MED ORDER — PANTOPRAZOLE SODIUM 40 MG PO TBEC: 40.0000 mg | DELAYED_RELEASE_TABLET | Freq: Every day | ORAL | Status: AC

## 2012-04-19 NOTE — Discharge Summary (Signed)
Physician Discharge Summary  Patient ID: Mario Shields MRN: [id number] DOB/AGE: 08/15/1960 52 y.o.  Admit date: 04/15/2012 Discharge date: 04/19/2012   Discharge Diagnoses:  Principal Problem:   Hemiparesis Active Problems:   MS (multiple sclerosis)   Gait instability   DIABETES MELLITUS-TYPE II   GERD   Anemia   Essential hypertension, benign   Other and unspecified hyperlipidemia   GERD (gastroesophageal reflux disease)   Discharged Condition: good  Hospital Course: patient is a 52 year old presented [facility] acute onset of weakness and difficulty ambulating. He has a history of multiple sclerosis and DM-1.  He has not had regular follow-up for diabetes or for multiple sclerosis. Workup in the ER was significant for severe hyperglycemia and an MRI that showed enhancement of the white matter in the left brain suggestive of a flare of MS. He was started on high dose corticosteroids and high dose insulin for this with gradual improvement of his sxs. At the time of discharge he has mild right hemiparesis, and he is able to ambulate with a walker and has no trouble with swallowing. He will be discharged to home with followup with his neurologist in 1 week to help with restarting avonex.   Consults: neurology  Significant Diagnostic Studies:  No results found.  Labs: Lab Results  Component Value Date   WBC 15.4* 04/19/2012   HGB 12.2* 04/19/2012   HCT 33.8* 04/19/2012   MCV 85.4 04/19/2012   PLT 157 04/19/2012     Recent Labs Lab 04/15/12 1559  04/19/12 0620  NA 136  < > 141  K 4.2  < > 3.3*  CL 98  < > 108  CO2 22  < > 24  BUN 11  < > 13  CREATININE 0.76  < > 0.64  CALCIUM 10.2  < > 8.7  PROT 7.8  --   --   BILITOT 0.3  --   --   ALKPHOS 93  --   --   ALT 25  --   --   AST 23  --   --   GLUCOSE 416*  < > 175*  < > = values in this interval not displayed.     No results found for this basename: INR, PROTIME     No results found for this or any previous visit  (from the past 240 hour(s)).    Discharge Exam: Blood pressure 125/76, pulse 58, temperature 98.1 F (36.7 C), temperature source Oral, resp. rate 18, height 5\' 8"  (1.727 m), weight 83.3 kg (183 lb 10.3 oz), SpO2 100.00%.  Physical Exam: Well nourshed well developed man in no apparent distress. HEENT exam was significant for a mild right facial droop. He has disconjugate gaze laterally. Chest was clear, Heart had a regular rate and rhythm, abdomen was benign, Neuro exam: alert and well oriented with a normal affect, he has a mild right facial droop, he has 4/5 RUE and RLE strength, and he is able to walk with a walker.  Disposition: He should call Dr. Silvano Rusk office to arrange a followup visit within 1 week of discharge and he should be seen by Dr. Anne Hahn in 1 week at 930 am.  Discharge Orders   Future Orders Complete By Expires     Call MD for:  As directed     Comments:      Call the physician if you develop fever, chills, worsening abilty to ambulate or any other concerning sxs.    Diet - low  sodium heart healthy  As directed     Discharge instructions  As directed     Comments:      He'll be discharged to home today.  He should use his walker at all times to prevent falls. He is to see his neurologist in 1 week at 9:30 AM for a post hospitalization visit. He should check his blood sugars twice daily and call us if sugars are consistently over 300 or less than 70.    Increase activity slowly  As directed         Medication List    TAKE these medications       insulin glargine 100 UNIT/ML injection  Commonly known as:  LANTUS  Inject 30 Units into the skin daily.     interferon beta-1a 30 MCG/0.5ML injection  Commonly known as:  AVONEX  Inject 0.5 mLs (30 mcg total) into the muscle once a week.     multivitamin with minerals Tabs  Take 1 tablet by mouth daily.     pantoprazole 40 MG tablet  Commonly known as:  PROTONIX  Take 1 tablet (40 mg total) by mouth daily at 6  (six) AM.     valsartan-hydrochlorothiazide 320-12.5 MG per tablet  Commonly known as:  DIOVAN-HCT  Take 1 tablet by mouth daily.         Signed: Garlan Fillers 04/19/2012, 2:33 PM

## 2012-04-19 NOTE — Progress Notes (Signed)
Inpatient Diabetes Program Recommendations  AACE/ADA: New Consensus Statement on Inpatient Glycemic Control (2013)  Target Ranges:  Prepandial:   less than 140 mg/dL      Peak postprandial:   less than 180 mg/dL (1-2 hours)      Critically ill patients:  140 - 180 mg/dL  Results for BIRAN, MAYBERRY (MRN [id number]) as of 04/19/2012 11:31  Ref. Range 04/18/2012 16:53 04/18/2012 20:25 04/19/2012 00:26 04/19/2012 05:20 04/19/2012 07:32  Glucose-Capillary Latest Range: 70-99 mg/dL 213 (H) 086 (H) 578 (H) 176 (H) 145 (H)    Inpatient Diabetes Program Recommendations Correction (SSI): Change to TID + HS scale per Glycemic control order set  Thank you  Piedad Climes BSN, RN,CDE Inpatient Diabetes Coordinator [phone] (team pager)

## 2012-04-19 NOTE — Care Management Note (Signed)
  Page 1 of 1   04/19/2012     9:08:21 AM   CARE MANAGEMENT NOTE 04/19/2012  Patient:  Mario Shields, Mario Shields   Account Number:  [id number]  Date Initiated:  04/19/2012  Documentation initiated by:  Ronny Flurry  Subjective/Objective Assessment:   DX: MS     Action/Plan:   Solumedrol IV daily x 5 days   Anticipated DC Date:  04/19/2012   Anticipated DC Plan:  HOME W HOME HEALTH SERVICES         Choice offered to / List presented to:  C-1 Patient   DME arranged  Levan Hurst      DME agency  [facility]     HH arranged  HH-2 PT  HH-3 OT      [place] agency  [facility]   Status of service:  Completed, signed off Medicare Important Message given?   (If response is "NO", the following Medicare IM given date fields will be blank) Date Medicare IM given:   Date Additional Medicare IM given:    Discharge Disposition:    Per UR Regulation:  Reviewed for med. necessity/level of care/duration of stay  If discussed at Long Length of Stay Meetings, dates discussed:    Comments:

## 2012-04-19 NOTE — Progress Notes (Signed)
Patient discharged to home with instructions, verbalized understanding. 

## 2012-04-19 NOTE — Progress Notes (Addendum)
NEURO HOSPITALIST PROGRESS NOTE   SUBJECTIVE:                                                                                                                        No complaints.   OBJECTIVE:                                                                                                                           Vital signs in last 24 hours: Temp:  [98 F (36.7 C)-98.9 F (37.2 C)] 98.9 F (37.2 C) (02/21 0452) Pulse Rate:  [46-86] 46 (02/21 0407) Resp:  [16-20] 16 (02/21 0452) BP: (101-153)/(58-92) 128/72 mmHg (02/21 0452) SpO2:  [96 %-100 %] 96 % (02/21 0452) Weight:  [83.3 kg (183 lb 10.3 oz)-83.326 kg (183 lb 11.2 oz)] 83.3 kg (183 lb 10.3 oz) (02/21 0500)  Intake/Output from previous day: 02/20 0701 - 02/21 0700 In: 2310 [P.O.:1160; I.V.:1150] Out: 1550 [Urine:1550] Intake/Output this shift:   Nutritional status: Carb Control  Past Medical History  Diagnosis Date  . MS (multiple sclerosis)   . Hypertension   . Diabetes mellitus without complication     Neurologic ROS negative with exception of above. Musculoskeletal ROS none  Neurologic Exam:  Mental Status:  Alert, oriented, thought content appropriate. Speech dysarthric without evidence of aphasia. Able to follow 3 step commands without difficulty.  Cranial Nerves:  II:  pupils equal, round, reactive to light and accommodation  III,IV, VI: ptosis Present right eye, extra-ocular motions shows a right and left INO with nystagmus --slightly improved today V,VII: smile asymmetric with decreased right facial elevation, facial light touch sensation normal bilaterally  VIII: hearing normal bilaterally  IX,X: gag reflex present  XI: bilateral shoulder shrug  XII: midline tongue extension  Motor:  Right : Upper extremity 4/5   Left:  Upper extremity 5/5   Lower extremity 4/5    Lower extremity 5/5  --right pronator drift  Tone and bulk:normal tone throughout; no atrophy noted   Sensory: Pinprick and light touch intact throughout, bilaterally  Deep Tendon Reflexes: 2+ and symmetric throughout  Plantars:  Right: downgoing Left: downgoing  Cerebellar:  normal finger-to-nose shows dysmetria bilaterally  CV: pulses palpable throughout    Lab Results: No  results found for this basename: cbc, bmp, coags, chol, tri, ldl, hga1c   Lipid Panel No results found for this basename: CHOL, TRIG, HDL, CHOLHDL, VLDL, LDLCALC,  in the last 72 hours  Studies/Results: No results found.  MEDICATIONS                                                                                                                        Scheduled: . enoxaparin (LOVENOX) injection  40 mg Subcutaneous Q24H  . insulin aspart  0-15 Units Subcutaneous Q4H  . insulin glargine  40 Units Subcutaneous BID  . irbesartan  300 mg Oral Daily  . methylPREDNISolone (SOLU-MEDROL) injection  1,000 mg Intravenous Daily  . multivitamin with minerals  1 tablet Oral Daily  . pantoprazole  40 mg Oral Q0600    ASSESSMENT/PLAN:                                                                                                             MS:  Patient is doing well, receiving his last dose of Solumedrol today with plans to discharge today.  I have called [facility] and patient has a outstanding balance $142.00since 2010. I have discussed with the accounting department of [employer] and he has a appointment on Friday Feb 28th at 9:30 in the AM. He will have to pay half of his outstanding balance at that time.   Assessment and plan discussed with with attending physician and they are in agreement.    Felicie Morn PA-C [facility] [phone]  04/19/2012, 8:28 AM  I have seen and evaluated the patient. I have reviewed the above note and made appropriate changes. Completed steroids. PT rec home health PT, will need immunomodulating therapy started as outpatient.   Ritta Slot, MD [facility] [phone]  If 7pm- 7am, please page neurology on call at [phone].

## 2012-07-26 ENCOUNTER — Encounter: Payer: Self-pay | Admitting: Nurse Practitioner

## 2012-07-26 ENCOUNTER — Ambulatory Visit (INDEPENDENT_AMBULATORY_CARE_PROVIDER_SITE_OTHER): Payer: Medicaid Other | Admitting: Nurse Practitioner

## 2012-07-26 VITALS — BP 116/83 | HR 94 | Temp 99.0°F | Ht 67.0 in | Wt 158.0 lb

## 2012-07-26 DIAGNOSIS — G35 Multiple sclerosis: Secondary | ICD-10-CM

## 2012-07-26 DIAGNOSIS — R269 Unspecified abnormalities of gait and mobility: Secondary | ICD-10-CM

## 2012-07-26 DIAGNOSIS — R2681 Unsteadiness on feet: Secondary | ICD-10-CM

## 2012-07-26 LAB — COMPREHENSIVE METABOLIC PANEL
Albumin/Globulin Ratio: 1.8 (ref 1.1–2.5)
Albumin: 4.6 g/dL (ref 3.5–5.5)
BUN: 17 mg/dL (ref 6–24)
GFR calc Af Amer: 122 mL/min/{1.73_m2} (ref >59)
GFR calc non Af Amer: 106 mL/min/{1.73_m2} (ref >59)
Glucose: 361 mg/dL — ABNORMAL HIGH (ref 65–99)
Total Bilirubin: 0.3 mg/dL (ref 0.0–1.2)
Total Protein: 7.1 g/dL (ref 6.0–8.5)

## 2012-07-26 NOTE — Progress Notes (Signed)
HPI: Patient returns for followup after initial evaluation with Dr. Marjory Lies 04/26/2012. He has a history of diabetes, hypertension, but also multiple sclerosis. He was diagnosed in 1990 he developed numbness of the right hand and vision changes. He was started on Betaseron at that time and took the medication for approximately 6 months and then stopped due to side effects. Over the years he has not followed up with neurology. He began using a cane in 2008, and over the past several years has used a wheelchair and a walker. He is now on tecfidera tolerating the medication without side effects.  ROS:  blurred vision,increased thirst  Physical Exam General: well developed, well nourished, seated, in no evident distress Head: head normocephalic and atraumatic. Oropharynx benign Neck: supple with no carotid  bruits Cardiovascular: regular rate and rhythm, no murmurs  Neurologic Exam Mental Status: Awake and fully alert. Oriented to place and time. Follows all commands. Speech and language normal.   Cranial Nerves: Fundoscopic exam reveals sharp disc margins. Pupils equal, briskly reactive to light. On right gaze left eye doesn't past midline and right eye abducts with no nystagmus. On left gaze right doesn't past midline and left abducts without nystagmus. Up and down gaze is normal Visual fields full to confrontation. Hearing intact and symmetric to finger snap. Facial sensation intact. Face, tongue, palate move normally and symmetrically. Neck flexion and extension normal.  Motor:  Normal strength in all tested extremity muscles.No focal weakness, increased tone in upper extremities Sensory.: intact to touch decreased to pinprick to mid shin decreased vibratory  and knees Coordination: Rapid alternating movements slowed  Gait and Station: Arises from chair without difficulty. Ambulated 35 feet with a rolling walker, no difficulty with turns  Reflexes: 3+  and symmetric except 2+ at ankles. Toes  downgoing.     ASSESSMENT: Relapsing remitting multiple sclerosis    PLAN: Continue tecfidera at current dose Will get labs today Continue to do home exercise program Nilda Riggs, GNP-BC APRN

## 2012-07-26 NOTE — Patient Instructions (Addendum)
Continue tecfidera Will get labs today Continue home exercise program Followup in 6 months

## 2012-07-29 NOTE — Progress Notes (Signed)
Quick Note:  I called and spoke with patient concerning his lab results being normal. ______

## 2012-07-29 NOTE — Progress Notes (Signed)
I reviewed note and agree with plan.   Suanne Marker, MD 07/29/2012, 2:29 PM Certified in Neurology, Neurophysiology and Neuroimaging  [facility] [address], [address] [place], [place] [address] [phone]

## 2012-08-12 ENCOUNTER — Ambulatory Visit: Payer: Medicare Other | Attending: Internal Medicine | Admitting: Physical Therapy

## 2012-08-12 ENCOUNTER — Ambulatory Visit: Payer: Medicare Other | Admitting: Physical Therapy

## 2012-08-12 DIAGNOSIS — R269 Unspecified abnormalities of gait and mobility: Secondary | ICD-10-CM | POA: Insufficient documentation

## 2012-08-12 DIAGNOSIS — R209 Unspecified disturbances of skin sensation: Secondary | ICD-10-CM | POA: Insufficient documentation

## 2012-08-12 DIAGNOSIS — R5381 Other malaise: Secondary | ICD-10-CM | POA: Insufficient documentation

## 2012-08-12 DIAGNOSIS — R262 Difficulty in walking, not elsewhere classified: Secondary | ICD-10-CM | POA: Insufficient documentation

## 2012-08-12 DIAGNOSIS — M6281 Muscle weakness (generalized): Secondary | ICD-10-CM | POA: Insufficient documentation

## 2012-08-12 DIAGNOSIS — IMO0001 Reserved for inherently not codable concepts without codable children: Secondary | ICD-10-CM | POA: Insufficient documentation

## 2012-08-20 ENCOUNTER — Ambulatory Visit: Payer: Medicare Other | Admitting: Physical Therapy

## 2012-08-22 ENCOUNTER — Ambulatory Visit: Payer: Medicare Other | Admitting: Physical Therapy

## 2012-08-27 ENCOUNTER — Ambulatory Visit: Payer: Medicare Other | Attending: Internal Medicine | Admitting: Physical Therapy

## 2012-08-27 DIAGNOSIS — IMO0001 Reserved for inherently not codable concepts without codable children: Secondary | ICD-10-CM | POA: Insufficient documentation

## 2012-08-27 DIAGNOSIS — R269 Unspecified abnormalities of gait and mobility: Secondary | ICD-10-CM | POA: Insufficient documentation

## 2012-08-27 DIAGNOSIS — R209 Unspecified disturbances of skin sensation: Secondary | ICD-10-CM | POA: Insufficient documentation

## 2012-08-27 DIAGNOSIS — R262 Difficulty in walking, not elsewhere classified: Secondary | ICD-10-CM | POA: Insufficient documentation

## 2012-08-27 DIAGNOSIS — R5381 Other malaise: Secondary | ICD-10-CM | POA: Insufficient documentation

## 2012-08-27 DIAGNOSIS — M6281 Muscle weakness (generalized): Secondary | ICD-10-CM | POA: Insufficient documentation

## 2012-08-29 ENCOUNTER — Ambulatory Visit: Payer: Medicare Other | Admitting: Physical Therapy

## 2012-09-03 ENCOUNTER — Ambulatory Visit: Payer: Medicare Other | Admitting: Physical Therapy

## 2012-09-05 ENCOUNTER — Ambulatory Visit: Payer: Medicare Other | Admitting: Physical Therapy

## 2012-09-10 ENCOUNTER — Ambulatory Visit: Payer: Medicare Other | Admitting: Physical Therapy

## 2012-09-12 ENCOUNTER — Ambulatory Visit: Payer: Medicare Other | Admitting: Physical Therapy

## 2013-01-28 ENCOUNTER — Ambulatory Visit: Payer: Medicaid Other | Admitting: Nurse Practitioner

## 2013-06-25 ENCOUNTER — Other Ambulatory Visit: Payer: Self-pay | Admitting: Diagnostic Neuroimaging

## 2013-10-14 ENCOUNTER — Other Ambulatory Visit: Payer: Self-pay | Admitting: Diagnostic Neuroimaging

## 2014-05-29 DIAGNOSIS — R1310 Dysphagia, unspecified: Secondary | ICD-10-CM

## 2014-05-29 DIAGNOSIS — R531 Weakness: Secondary | ICD-10-CM

## 2014-05-29 DIAGNOSIS — R4781 Slurred speech: Secondary | ICD-10-CM

## 2014-05-29 HISTORY — DX: Slurred speech: R47.81

## 2014-05-29 HISTORY — DX: Weakness: R53.1

## 2014-05-29 HISTORY — DX: Dysphagia, unspecified: R13.10

## 2014-06-03 ENCOUNTER — Inpatient Hospital Stay (HOSPITAL_COMMUNITY)
Admission: EM | Admit: 2014-06-03 | Discharge: 2014-06-07 | DRG: 872 | Disposition: A | Discharge status: Home Health | Payer: Medicare (Managed Care) | Attending: Internal Medicine | Admitting: Internal Medicine

## 2014-06-03 ENCOUNTER — Encounter (HOSPITAL_COMMUNITY): Payer: Self-pay | Admitting: *Deleted

## 2014-06-03 ENCOUNTER — Emergency Department (HOSPITAL_COMMUNITY): Payer: Medicare (Managed Care)

## 2014-06-03 DIAGNOSIS — K219 Gastro-esophageal reflux disease without esophagitis: Secondary | ICD-10-CM | POA: Diagnosis present

## 2014-06-03 DIAGNOSIS — R509 Fever, unspecified: Secondary | ICD-10-CM

## 2014-06-03 DIAGNOSIS — R1310 Dysphagia, unspecified: Secondary | ICD-10-CM | POA: Diagnosis not present

## 2014-06-03 DIAGNOSIS — G35 Multiple sclerosis: Secondary | ICD-10-CM | POA: Diagnosis present

## 2014-06-03 DIAGNOSIS — I10 Essential (primary) hypertension: Secondary | ICD-10-CM | POA: Diagnosis present

## 2014-06-03 DIAGNOSIS — R4781 Slurred speech: Secondary | ICD-10-CM | POA: Diagnosis present

## 2014-06-03 DIAGNOSIS — Z7982 Long term (current) use of aspirin: Secondary | ICD-10-CM

## 2014-06-03 DIAGNOSIS — A419 Sepsis, unspecified organism: Secondary | ICD-10-CM | POA: Diagnosis present

## 2014-06-03 DIAGNOSIS — Z8249 Family history of ischemic heart disease and other diseases of the circulatory system: Secondary | ICD-10-CM

## 2014-06-03 DIAGNOSIS — M25511 Pain in right shoulder: Secondary | ICD-10-CM | POA: Diagnosis present

## 2014-06-03 DIAGNOSIS — G35A Relapsing-remitting multiple sclerosis: Secondary | ICD-10-CM | POA: Diagnosis present

## 2014-06-03 DIAGNOSIS — N3289 Other specified disorders of bladder: Secondary | ICD-10-CM | POA: Diagnosis present

## 2014-06-03 DIAGNOSIS — Z9181 History of falling: Secondary | ICD-10-CM

## 2014-06-03 DIAGNOSIS — Z794 Long term (current) use of insulin: Secondary | ICD-10-CM

## 2014-06-03 DIAGNOSIS — N390 Urinary tract infection, site not specified: Secondary | ICD-10-CM | POA: Diagnosis present

## 2014-06-03 DIAGNOSIS — E119 Type 2 diabetes mellitus without complications: Secondary | ICD-10-CM | POA: Diagnosis present

## 2014-06-03 DIAGNOSIS — Z833 Family history of diabetes mellitus: Secondary | ICD-10-CM

## 2014-06-03 DIAGNOSIS — K5900 Constipation, unspecified: Secondary | ICD-10-CM | POA: Diagnosis present

## 2014-06-03 DIAGNOSIS — F17210 Nicotine dependence, cigarettes, uncomplicated: Secondary | ICD-10-CM | POA: Diagnosis present

## 2014-06-03 DIAGNOSIS — F0390 Unspecified dementia without behavioral disturbance: Secondary | ICD-10-CM | POA: Diagnosis present

## 2014-06-03 DIAGNOSIS — Z9119 Patient's noncompliance with other medical treatment and regimen: Secondary | ICD-10-CM | POA: Diagnosis present

## 2014-06-03 DIAGNOSIS — E785 Hyperlipidemia, unspecified: Secondary | ICD-10-CM | POA: Diagnosis present

## 2014-06-03 DIAGNOSIS — Z79899 Other long term (current) drug therapy: Secondary | ICD-10-CM

## 2014-06-03 DIAGNOSIS — F329 Major depressive disorder, single episode, unspecified: Secondary | ICD-10-CM | POA: Diagnosis present

## 2014-06-03 HISTORY — DX: Type 2 diabetes mellitus without complications: E11.9

## 2014-06-03 HISTORY — DX: Anemia, unspecified: D64.9

## 2014-06-03 HISTORY — DX: Hyperlipidemia, unspecified: E78.5

## 2014-06-03 HISTORY — DX: Depression, unspecified: F32.A

## 2014-06-03 HISTORY — DX: Gastro-esophageal reflux disease without esophagitis: K21.9

## 2014-06-03 HISTORY — DX: Major depressive disorder, single episode, unspecified: F32.9

## 2014-06-03 LAB — I-STAT CHEM 8, ED
BUN: 8 mg/dL (ref 6–23)
CALCIUM ION: 1.26 mmol/L — AB (ref 1.12–1.23)
Chloride: 99 mmol/L (ref 96–112)
Creatinine, Ser: 0.8 mg/dL (ref 0.50–1.35)
Glucose, Bld: 133 mg/dL — ABNORMAL HIGH (ref 70–99)
HCT: 52 % (ref 39.0–52.0)
HEMOGLOBIN: 17.7 g/dL — AB (ref 13.0–17.0)
Potassium: 3.7 mmol/L (ref 3.5–5.1)
SODIUM: 141 mmol/L (ref 135–145)
TCO2: 23 mmol/L (ref 0–100)

## 2014-06-03 LAB — COMPREHENSIVE METABOLIC PANEL
ALBUMIN: 4.4 g/dL (ref 3.5–5.2)
ALT: 37 U/L (ref 0–53)
AST: 22 U/L (ref 0–37)
Alkaline Phosphatase: 116 U/L (ref 39–117)
Anion gap: 12 (ref 5–15)
BUN: 6 mg/dL (ref 6–23)
CO2: 28 mmol/L (ref 19–32)
Calcium: 10.5 mg/dL (ref 8.4–10.5)
Chloride: 100 mmol/L (ref 96–112)
Creatinine, Ser: 0.78 mg/dL (ref 0.50–1.35)
GFR calc Af Amer: >90 mL/min (ref >90)
GFR calc non Af Amer: >90 mL/min (ref >90)
Glucose, Bld: 97 mg/dL (ref 70–99)
POTASSIUM: 3.8 mmol/L (ref 3.5–5.1)
Sodium: 140 mmol/L (ref 135–145)
TOTAL PROTEIN: 7.8 g/dL (ref 6.0–8.3)
Total Bilirubin: 1 mg/dL (ref 0.3–1.2)

## 2014-06-03 LAB — CBC WITH DIFFERENTIAL/PLATELET
BASOS ABS: 0 10*3/uL (ref 0.0–0.1)
Basophils Relative: 0 % (ref 0–1)
Eosinophils Absolute: 0 10*3/uL (ref 0.0–0.7)
Eosinophils Relative: 0 % (ref 0–5)
HCT: 44.5 % (ref 39.0–52.0)
Hemoglobin: 15.9 g/dL (ref 13.0–17.0)
Lymphocytes Relative: 15 % (ref 12–46)
Lymphs Abs: 3.8 10*3/uL (ref 0.7–4.0)
MCH: 30.3 pg (ref 26.0–34.0)
MCHC: 35.7 g/dL (ref 30.0–36.0)
MCV: 84.9 fL (ref 78.0–100.0)
MONOS PCT: 11 % (ref 3–12)
Monocytes Absolute: 2.8 10*3/uL — ABNORMAL HIGH (ref 0.1–1.0)
NEUTROS ABS: 18.4 10*3/uL — AB (ref 1.7–7.7)
Neutrophils Relative %: 74 % (ref 43–77)
Platelets: 214 10*3/uL (ref 150–400)
RBC: 5.24 MIL/uL (ref 4.22–5.81)
RDW: 13.2 % (ref 11.5–15.5)
WBC: 25 10*3/uL — ABNORMAL HIGH (ref 4.0–10.5)

## 2014-06-03 LAB — URINALYSIS, ROUTINE W REFLEX MICROSCOPIC
BILIRUBIN URINE: NEGATIVE
Glucose, UA: NEGATIVE mg/dL
KETONES UR: NEGATIVE mg/dL
NITRITE: POSITIVE — AB
Protein, ur: NEGATIVE mg/dL
Specific Gravity, Urine: 1.01 (ref 1.005–1.030)
UROBILINOGEN UA: 1 mg/dL (ref 0.0–1.0)
pH: 6 (ref 5.0–8.0)

## 2014-06-03 LAB — GLUCOSE, CAPILLARY
Glucose-Capillary: 118 mg/dL — ABNORMAL HIGH (ref 70–99)
Glucose-Capillary: 124 mg/dL — ABNORMAL HIGH (ref 70–99)

## 2014-06-03 LAB — CBG MONITORING, ED: Glucose-Capillary: 101 mg/dL — ABNORMAL HIGH (ref 70–99)

## 2014-06-03 LAB — URINE MICROSCOPIC-ADD ON

## 2014-06-03 MED ORDER — BUPROPION HCL ER (SR) 150 MG PO TB12
150.0000 mg | ORAL_TABLET | Freq: Two times a day (BID) | ORAL | Status: DC
  Administered 2014-06-04 – 2014-06-07 (×6): 150 mg via ORAL
  Filled 2014-06-03 (×9): qty 1

## 2014-06-03 MED ORDER — DIMETHYL FUMARATE 240 MG PO CPDR
1.0000 | DELAYED_RELEASE_CAPSULE | Freq: Two times a day (BID) | ORAL | Status: DC
  Filled 2014-06-03: qty 1

## 2014-06-03 MED ORDER — CHLORHEXIDINE GLUCONATE 0.12 % MT SOLN
15.0000 mL | Freq: Two times a day (BID) | OROMUCOSAL | Status: DC
  Administered 2014-06-03 – 2014-06-07 (×8): 15 mL via OROMUCOSAL
  Filled 2014-06-03 (×10): qty 15

## 2014-06-03 MED ORDER — CETYLPYRIDINIUM CHLORIDE 0.05 % MT LIQD
7.0000 mL | Freq: Two times a day (BID) | OROMUCOSAL | Status: DC
  Administered 2014-06-05 – 2014-06-07 (×5): 7 mL via OROMUCOSAL

## 2014-06-03 MED ORDER — VALSARTAN-HYDROCHLOROTHIAZIDE 320-12.5 MG PO TABS: 1.0000 | ORAL_TABLET | Freq: Every day | ORAL | Status: DC

## 2014-06-03 MED ORDER — PANTOPRAZOLE SODIUM 40 MG PO TBEC
40.0000 mg | DELAYED_RELEASE_TABLET | Freq: Every day | ORAL | Status: DC
  Administered 2014-06-05 – 2014-06-07 (×3): 40 mg via ORAL
  Filled 2014-06-03 (×3): qty 1

## 2014-06-03 MED ORDER — HEPARIN SODIUM (PORCINE) 5000 UNIT/ML IJ SOLN
5000.0000 [IU] | Freq: Three times a day (TID) | INTRAMUSCULAR | Status: DC
  Administered 2014-06-04 – 2014-06-07 (×11): 5000 [IU] via SUBCUTANEOUS
  Filled 2014-06-03 (×12): qty 1

## 2014-06-03 MED ORDER — DARIFENACIN HYDROBROMIDE ER 7.5 MG PO TB24
7.5000 mg | ORAL_TABLET | Freq: Every day | ORAL | Status: DC
  Filled 2014-06-03: qty 1

## 2014-06-03 MED ORDER — ASPIRIN EC 81 MG PO TBEC
81.0000 mg | DELAYED_RELEASE_TABLET | Freq: Every day | ORAL | Status: DC
  Administered 2014-06-05 – 2014-06-07 (×3): 81 mg via ORAL
  Filled 2014-06-03 (×5): qty 1

## 2014-06-03 MED ORDER — CEFTRIAXONE SODIUM IN DEXTROSE 20 MG/ML IV SOLN
1.0000 g | INTRAVENOUS | Status: DC
  Administered 2014-06-03 – 2014-06-04 (×2): 1 g via INTRAVENOUS
  Filled 2014-06-03 (×3): qty 50

## 2014-06-03 MED ORDER — IRBESARTAN 300 MG PO TABS
300.0000 mg | ORAL_TABLET | Freq: Every day | ORAL | Status: DC
  Filled 2014-06-03: qty 1

## 2014-06-03 MED ORDER — PNEUMOCOCCAL VAC POLYVALENT 25 MCG/0.5ML IJ INJ
0.5000 mL | INJECTION | INTRAMUSCULAR | Status: AC
  Administered 2014-06-04: 0.5 mL via INTRAMUSCULAR
  Filled 2014-06-03 (×2): qty 0.5

## 2014-06-03 MED ORDER — DIMETHYL FUMARATE 240 MG PO CPDR: 1.0000 | DELAYED_RELEASE_CAPSULE | Freq: Two times a day (BID) | ORAL | Status: DC

## 2014-06-03 MED ORDER — ATORVASTATIN CALCIUM 40 MG PO TABS
40.0000 mg | ORAL_TABLET | Freq: Every day | ORAL | Status: DC
  Administered 2014-06-04 – 2014-06-06 (×3): 40 mg via ORAL
  Filled 2014-06-03 (×5): qty 1

## 2014-06-03 MED ORDER — GADOBENATE DIMEGLUMINE 529 MG/ML IV SOLN: 15.0000 mL | Freq: Once | INTRAVENOUS | Status: AC | PRN

## 2014-06-03 MED ORDER — ADULT MULTIVITAMIN W/MINERALS CH
1.0000 | ORAL_TABLET | Freq: Every day | ORAL | Status: DC
  Administered 2014-06-05 – 2014-06-07 (×3): 1 via ORAL
  Filled 2014-06-03 (×5): qty 1

## 2014-06-03 MED ORDER — HYDROCHLOROTHIAZIDE 12.5 MG PO CAPS
12.5000 mg | ORAL_CAPSULE | Freq: Every day | ORAL | Status: DC
  Filled 2014-06-03: qty 1

## 2014-06-03 MED ORDER — VITAMIN D3 25 MCG (1000 UNIT) PO TABS
1000.0000 [IU] | ORAL_TABLET | Freq: Every day | ORAL | Status: DC
  Administered 2014-06-05 – 2014-06-07 (×3): 1000 [IU] via ORAL
  Filled 2014-06-03 (×5): qty 1

## 2014-06-03 MED ORDER — SODIUM CHLORIDE 0.9 % IV SOLN: INTRAVENOUS | Status: DC

## 2014-06-03 MED ORDER — SODIUM CHLORIDE 0.9 % IV SOLN
INTRAVENOUS | Status: DC
  Administered 2014-06-03: via INTRAVENOUS

## 2014-06-03 NOTE — ED Provider Notes (Signed)
CSN: [id number]     Arrival date & time 06/03/14  1013 History   First MD Initiated Contact with Patient 06/03/14 1021     Chief Complaint  Patient presents with  . Weakness   Chief complaint fall  (Consider location/radiation/quality/duration/timing/severity/associated sxs/prior Treatment) HPI  Level V caveat dementia History is obtained from EMS from patient and from patient's brother who accompanies him and lives with him.  Patient fell this morning while attempting to get into the shower. His brother notes that his speech is somewhat slurred however speech is intermittently slurred, and similar to this secondary to multiple sclerosis. Patient has been noncompliant with his diabetic and multiple sclerosis medications. Patient denies any new weakness. Complains of right shoulder pain which she's had for several months, unchanged. No injuries result fall today. EMS treated patient with aspirin prior to coming here. No chest pain no fever no other associated symptoms Past Medical History  Diagnosis Date  . MS (multiple sclerosis)   . Hypertension   . 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 No pertinent past surgical history. History reviewed. No pertinent family history. History  Substance Use Topics  . Smoking status: Current Every Day Smoker -- 0.50 packs/day for 10 years    Types: Cigarettes  . Smokeless tobacco: Never Used  . Alcohol Use: No    Review of Systems  Unable to perform ROS Constitutional: Negative.   HENT: Negative.   Respiratory: Negative.   Cardiovascular: Negative.   Gastrointestinal: Negative.   Musculoskeletal: Positive for arthralgias and gait problem.       Paraplegic, chronic right shoulder pain  Skin: Negative.   Neurological: Positive for speech difficulty.  Psychiatric/Behavioral: Negative.    otherwise unobtainable, patient demented    Allergies  Review of patient's allergies indicates no known  allergies.  Home Medications   Prior to Admission medications   Medication Sig Start Date End Date Taking? Authorizing Provider  BAYER CONTOUR TEST test strip  05/17/12   Historical Provider, MD  buPROPion [facility] SR) 100 MG 12 hr tablet  07/06/12   Historical Provider, MD  Dimethyl Fumarate (TECFIDERA) 240 MG CPDR Take 1 capsule (240 mg total) by mouth 2 (two) times daily. 06/25/13   Suanne Marker, MD  ENABLEX 7.5 MG 24 hr tablet  06/25/12   Historical Provider, MD  insulin glargine (LANTUS) 100 UNIT/ML injection Inject 30 Units into the skin daily. 04/19/12   Jarome Matin, MD  Multiple Vitamin (MULTIVITAMIN WITH MINERALS) TABS Take 1 tablet by mouth daily.    Historical Provider, MD  pantoprazole (PROTONIX) 40 MG tablet Take 1 tablet (40 mg total) by mouth daily at 6 (six) AM. 04/19/12   Jarome Matin, MD  valsartan-hydrochlorothiazide (DIOVAN-HCT) 320-12.5 MG per tablet Take 1 tablet by mouth daily.    Historical Provider, MD   BP 133/89 mmHg  Pulse 113  Temp(Src) 99.7 F (37.6 C) (Oral)  Resp 14  Ht 5\' 8"  (1.727 m)  Wt 167 lb (75.751 kg)  BMI 25.40 kg/m2  SpO2 95% Physical Exam  Constitutional:  Chronically ill-appearing  HENT:  Head: Normocephalic and atraumatic.  Eyes: Conjunctivae are normal. Pupils are equal, round, and reactive to light.  Right-sided medial strabismus  Neck: Neck supple. No tracheal deviation present. No thyromegaly present.  Cardiovascular: Normal rate and regular rhythm.   No murmur heard. Pulmonary/Chest: Effort normal and breath sounds normal.  Abdominal: Soft. Bowel sounds are normal. He exhibits no distension. There is no  tenderness.  Musculoskeletal: Normal range of motion. He exhibits no edema or tenderness.  Flexion contracture of right arm. Both lower extremities with muscular atrophy  Neurological: He is alert. He has normal reflexes. Coordination normal.  Speech mildly slurred. Right-sided cranial nerve III deficit other cranial  nerves II through XII grossly intact. DTRs symmetric bilaterally at knee jerk ankle jerk and biceps toes downward going bilaterally. Follows simple commands  Skin: Skin is warm and dry. No rash noted.  Psychiatric: He has a normal mood and affect.  Nursing note and vitals reviewed.   ED Course  Procedures (including critical care time) Labs Review Labs Reviewed - No data to display  Imaging Review No results found.   EKG Interpretation   Date/Time:  Wednesday June 03 2014 10:14:36 EDT Ventricular Rate:  114 PR Interval:  183 QRS Duration: 90 QT Interval:  332 QTC Calculation: 457 R Axis:   4 Text Interpretation:  Sinus tachycardia Probable left atrial enlargement  Inferior infarct, old SINCE LAST TRACING HEART RATE HAS INCREASED  Confirmed by Ethelda Chick  MD, Judith Demps [phone]) on 06/03/2014 4:36:31 PM     Results for orders placed or performed during the hospital encounter of 06/03/14  Urinalysis, Routine w reflex microscopic  Result Value Ref Range   Color, Urine YELLOW YELLOW   APPearance CLOUDY (A) CLEAR   Specific Gravity, Urine 1.010 1.005 - 1.030   pH 6.0 5.0 - 8.0   Glucose, UA NEGATIVE NEGATIVE mg/dL   Hgb urine dipstick SMALL (A) NEGATIVE   Bilirubin Urine NEGATIVE NEGATIVE   Ketones, ur NEGATIVE NEGATIVE mg/dL   Protein, ur NEGATIVE NEGATIVE mg/dL   Urobilinogen, UA 1.0 0.0 - 1.0 mg/dL   Nitrite POSITIVE (A) NEGATIVE   Leukocytes, UA TRACE (A) NEGATIVE  Urine microscopic-add on  Result Value Ref Range   WBC, UA 3-6 <3 WBC/hpf   RBC / HPF 0-2 <3 RBC/hpf   Bacteria, UA MANY (A) RARE  I-stat chem 8, ed  Result Value Ref Range   Sodium 141 135 - 145 mmol/L   Potassium 3.7 3.5 - 5.1 mmol/L   Chloride 99 96 - 112 mmol/L   BUN 8 6 - 23 mg/dL   Creatinine, Ser 6.04 0.50 - 1.35 mg/dL   Glucose, Bld 540 (H) 70 - 99 mg/dL   Calcium, Ion 9.81 (H) 1.12 - 1.23 mmol/L   TCO2 23 0 - 100 mmol/L   Hemoglobin 17.7 (H) 13.0 - 17.0 g/dL   HCT 19.1 47.8 - 29.5 %   Mr Laqueta Jean  Wo Contrast  06/03/2014   CLINICAL DATA:  54 year old male with multiple sclerosis. Found down, fall from wheelchair. Right side weakness and slurred speech. Initial encounter.  EXAM: MRI HEAD WITHOUT AND WITH CONTRAST  TECHNIQUE: Multiplanar, multiecho pulse sequences of the brain and surrounding structures were obtained without and with intravenous contrast.  CONTRAST:  15 mL MultiHance.  COMPARISON:  Brain MRI 04/15/2012 and earlier.  FINDINGS: Mild generalized cerebral volume loss since 2014.  New since 2014 is moderate to severe T2 signal abnormality involving the left caudate nucleus and right paracentral pons. These more resembles sequelae of small vessel ischemia then demyelinating disease, and there may be associated hemosiderin deposition at both sites.  However, there has been significant progression of abnormal T2 and FLAIR hyperintensity within the bilateral cerebellar peduncle is a which is typical of progressed demyelinating disease. There is also progressed indistinct T2 hyperintensity in the left paracentral pons. There is then significantly progressed bilateral cerebellar hemispheric  T2 hyperintensity which could relate to either chronic lacunar infarcts or demyelinating disease. This is more severe on the left (series 4, image 7).  Advanced chronic supratentorial white matter T2 and FLAIR hyperintensity then has not significantly changed since 2014.  No restricted diffusion or evidence of acute infarction. Major intracranial vascular flow voids are stable. No abnormal enhancement identified.  No midline shift, mass effect, evidence of mass lesion, ventriculomegaly, extra-axial collection or acute intracranial hemorrhage. Cervicomedullary junction and pituitary are within normal limits. 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 Grossly stable and negative visualized cervical spinal cord. Normal bone marrow signal.  Visible internal auditory structures appear normal. Mastoids are  clear. Trace paranasal sinus mucosal thickening. Bilateral orbits soft tissues appear stable. Visualized scalp soft tissues are within normal limits.  IMPRESSION: 1. Progressed signal abnormality in the brain since 2014 which suggest progression of both chronic demyelinating disease, and interval small vessel ischemia. 2. No acute intracranial abnormality or active demyelinating disease identified.   Electronically Signed   By: Odessa Fleming M.D.   On: 06/03/2014 15:06     MDM  MRI scan discussed with neurologist Dr. Amada Jupiter. No acute abnormality. Patient failed swallowing screen provided by nurse. In light of fact that he is unable to feed he will require inpatient stay and further swallowing studies. Spoke with internal medicine resident physician who arranged for admission Diagnosis #1 fall #2 dysphagia #3 medication noncompliance Final diagnoses:  None        Doug Sou, MD 06/03/14 1707

## 2014-06-03 NOTE — H&P (Signed)
Date: 06/03/2014               Patient Name:  Mario Shields MRN: [id number]  DOB: Feb 08, 1961 Age / Sex: 54 y.o., male   PCP: Jarome Matin, MD              Medical Service: Internal Medicine Teaching Service              Attending Physician: [name]    First Contact: [name], MSIV Pager: [phone]  Second Contact: [name]  Pager: [phone]       After Hours (After 5p/  First Contact Pager: [phone]  weekends / holidays): Second Contact Pager: [phone]   Chief Complaint: Slurred speech  The history was obtained from the patient and his sister.   History of Present Illness:Mario Shields is a 54 yo man with a PMH significant for multiple sclerosis, hypertension, hyperlipidemia, and diabetes mellitus who presents for further evaluation of slurred speech and a fall. His family reports that last night he slid out of bed, and his brother found him when he got home from work. The patient reports remembering this entire episode and states that he was just leaning over in the bed. He denies feeling strange or dizzy or losing consciousness. He seemed to be falling asleep while talking to the brother at that time. The brother though he may have urinated when he fell, and this morning tried to help him shower. The patient slumped over in the shower and was unable to support himself like he usually does. With this and when he continued to be lethargic this morning, the family decided to bring him to the ED for evaluation.   They also report that his MS has been in remission for many years until February of 2014 when he was admitted for weakness. Since then, he has declined significantly. He has been seen by outpatient PT and then seen by [facility] since then, but has never returned to his previous baseline. At his current baseline, he is unable to ambulate and requires assistance with transferring to his wheelchair. His family reports that his neurologists think this is not physiologic.  His  mental status has also declined some with repeating some of the same questions to his family. He is aware of his surroundings and is able to recognize his family members. They report low motivation and depression contributing to poor participation with rehabilitation services.There were many stresssors in the spring of last year including the death of his aunt and mother. The family states that he may withhold disclosing all of his symptoms to the medical team because of fear that they may decide to put him in a nursing home, though they state that this is not their intention. He is seen at [facility] 2-3 days per week.   Over the past few months, he has been worsening even more with decreased oral intake despite reporting normal appetite over the past 3 months. He has been choking on water some and this has become progressively more frequent over the past month. The family states that his tecfidera was stopped by his PCP prior to starting at [facility] due to concerns that it could be worsening his muscle weakness and slurred speech. He was re-started on this medicine recently, but the family is unsure of the exact timeline.   ROS is positive for constipation, with his brother reporting that he hasn't had a bowel movement in almost a month. He additionally has been complaining of  shoulder pain. The patient denies any dysuria or increased urinary frequency.   With regards to his history of MS, he was diagnosed in 78. He has previously been managed briefly with interferon beta-1b (betaseron) and  interferon beta-1a. He is followed by [name] office with neurology, last seen in May of 2014. He has since be controlled with dimethyl fumarate (tecfidera). He has also seen several other neurologists in the area, but the family thinks that [name] office is the most recent.   His PCP is [name] with [facility].    Meds: Current Facility-Administered Medications  Medication  Dose Route Frequency Provider Last Rate Last Dose  . gadobenate dimeglumine (MULTIHANCE) injection 15 mL  15 mL Intravenous Once PRN Medication Radiologist, MD       Current Outpatient Prescriptions  Medication Sig Dispense Refill  . aspirin EC 81 MG tablet Take 81 mg by mouth daily.    Marland Kitchen atorvastatin (LIPITOR) 40 MG tablet Take 40 mg by mouth at bedtime.    Marland Kitchen BAYER CONTOUR TEST test strip 1 each by Other route as needed.     Marland Kitchen buPROPion (WELLBUTRIN SR) 150 MG 12 hr tablet Take 150 mg by mouth 2 (two) times daily.    . cholecalciferol (VITAMIN D) 1000 UNITS tablet Take 1,000 Units by mouth daily.    . Dimethyl Fumarate (TECFIDERA) 240 MG CPDR Take 1 capsule (240 mg total) by mouth 2 (two) times daily. 180 capsule 0  . ENABLEX 7.5 MG 24 hr tablet Take 7.5 mg by mouth daily.     . insulin glargine (LANTUS) 100 UNIT/ML injection Inject 30 Units into the skin daily. (Patient taking differently: Inject 50 Units into the skin daily. ) 10 mL 12  . metFORMIN (GLUCOPHAGE-XR) 500 MG 24 hr tablet Take 1,000 mg by mouth daily with breakfast.    . Multiple Vitamin (MULTIVITAMIN WITH MINERALS) TABS Take 1 tablet by mouth daily.    . pantoprazole (PROTONIX) 40 MG tablet Take 1 tablet (40 mg total) by mouth daily at 6 (six) AM. 30 tablet 12  . valsartan-hydrochlorothiazide (DIOVAN-HCT) 320-12.5 MG per tablet Take 1 tablet by mouth daily.    Marland Kitchen buPROPion (WELLBUTRIN SR) 100 MG 12 hr tablet Take 100 mg by mouth daily.      Allergies: Allergies as of 06/03/2014  . (No Known Allergies)   Past Medical History  Diagnosis Date  . MS (multiple sclerosis)   . Hypertension   . Diabetes mellitus without complication   . Hyperlipidemia   . Anemia    Past Surgical History  Procedure Laterality Date  . Appendectomy     Family History  Problem Relation Age of Onset  . Cancer Father   . Diabetes    . Hypertension    . Heart disease    . Cancer     History   Social History  . Marital Status: Single     Spouse Name: N/A  . Number of Children: N/A  . Years of Education: N/A   Occupational History  . Not on file.   Social History Main Topics  . Smoking status: Current Every Day Smoker -- 0.50 packs/day for 10 years    Types: Cigarettes  . Smokeless tobacco: Never Used  . Alcohol Use: No  . Drug Use: No  . Sexual Activity: No   Other Topics Concern  . Not on file   Social History Narrative   Lives at home with brother and sister who take  care of him. Goes to [facility] 2-3 days per week.    Review of Systems: See HPI for pertinent positives and negatives.   Physical Exam: Blood pressure 137/86, pulse 107, temperature 98.9 F (37.2 C), temperature source Rectal, resp. rate 17, height  (1.727 m), weight 75.751 kg (167 lb), SpO2 95 %.   General: Lying in bed in NAD.  CV: Tachycardic rate, regular rhythm.  Pulm: Normal work of breathing, CTAB.  Abdomen: Active bowel sounds, soft, non-tender.  Neuro:  Mental status: Closes eyes between questions, but responds to questions. Uses brief, often one-word answers. Oriented to place and president. When asked about time, reports that it is March then 2015.  Cranial Nerves: Dysconjugate gaze. Tongue protrudes midline. Eyebrow raise is diminished; when asked to raise eyebrows, patient lifts arm and uses hand to lift eyelids.  Motor: Difficult to discern weakness from lack of effort. Upper extremity strength is diminished in bilateral biceps/triceps and grip strength. Is unable to lift lower extremities from the bed.   Lab results: Basic Metabolic Panel:  Recent Labs  16/10/96 1050 06/03/14 1631  NA 141 140  K 3.7 3.8  CL 99 100  CO2  --  28  GLUCOSE 133* 97  BUN 8 6  CREATININE 0.80 0.78  CALCIUM  --  10.5   Liver Function Tests:  Recent Labs  06/03/14 1631  AST 22  ALT 37  ALKPHOS 116  BILITOT 1.0  PROT 7.8  ALBUMIN 4.4   CBC:  Recent Labs  06/03/14 1050 06/03/14 1631  WBC  --  25.0*  NEUTROABS  --  18.4*  HGB  17.7* 15.9  HCT 52.0 44.5  MCV  --  84.9  PLT  --  214  CBG:  Recent Labs  06/03/14 1524  GLUCAP 101*   Urinalysis:  Recent Labs  06/03/14 1054  COLORURINE YELLOW  LABSPEC 1.010  PHURINE 6.0  GLUCOSEU NEGATIVE  HGBUR SMALL*  BILIRUBINUR NEGATIVE  KETONESUR NEGATIVE  PROTEINUR NEGATIVE  UROBILINOGEN 1.0  NITRITE POSITIVE*  LEUKOCYTESUR TRACE*   EKG: Reviewed in EPIC. Sinus tachycardia with possible left atrial enlargement and possible old inferior infarct.   Imaging results:  Mr Lodema Pilot Contrast  06/03/2014   CLINICAL DATA:  54 year old male with multiple sclerosis. Found down, fall from wheelchair. Right side weakness and slurred speech. Initial encounter.  EXAM: MRI HEAD WITHOUT AND WITH CONTRAST  TECHNIQUE: Multiplanar, multiecho pulse sequences of the brain and surrounding structures were obtained without and with intravenous contrast.  CONTRAST:  15 mL MultiHance.  COMPARISON:  Brain MRI 04/15/2012 and earlier.  FINDINGS: Mild generalized cerebral volume loss since 2014.  New since 2014 is moderate to severe T2 signal abnormality involving the left caudate nucleus and right paracentral pons. These more resembles sequelae of small vessel ischemia then demyelinating disease, and there may be associated hemosiderin deposition at both sites.  However, there has been significant progression of abnormal T2 and FLAIR hyperintensity within the bilateral cerebellar peduncle is a which is typical of progressed demyelinating disease. There is also progressed indistinct T2 hyperintensity in the left paracentral pons. There is then significantly progressed bilateral cerebellar hemispheric T2 hyperintensity which could relate to either chronic lacunar infarcts or demyelinating disease. This is more severe on the left (series 4, image 7).  Advanced chronic supratentorial white matter T2 and FLAIR hyperintensity then has not significantly changed since 2014.  No restricted diffusion or evidence  of acute infarction. Major intracranial vascular flow voids are stable.  No abnormal enhancement identified.  No midline shift, mass effect, evidence of mass lesion, ventriculomegaly, extra-axial collection or acute intracranial hemorrhage. Cervicomedullary junction and pituitary are within normal limits. 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 Grossly stable and negative visualized cervical spinal cord. Normal bone marrow signal.  Visible internal auditory structures appear normal. Mastoids are clear. Trace paranasal sinus mucosal thickening. Bilateral orbits soft tissues appear stable. Visualized scalp soft tissues are within normal limits.  IMPRESSION: 1. Progressed signal abnormality in the brain since 2014 which suggest progression of both chronic demyelinating disease, and interval small vessel ischemia. 2. No acute intracranial abnormality or active demyelinating disease identified.   Electronically Signed   By: Odessa Fleming M.D.   On: 06/03/2014 15:06   Assessment & Plan by Problem: Mr. Helzer is a 54 yo male with a PMH relevant for multiple sclerosis, diabetes, hypertension, and hyperlipidemia who presents to [facility] for further evaluation of slurred speech, lethargy, and leukocytosis.   Principal Problem:   MS (multiple sclerosis) Active Problems:   Diabetes mellitus   Essential hypertension, benign  Multiple sclerosis, now with weakness, slurred Speech, AMS: History of sub-acute slurred speech, AMS, and progressive dysarthria on top of chronic changes including decreased po intake. Differential includes chronic progression of MS vs acute MS exacerbation vs stroke. MRI reassuring against acute MS exacerbation, stroke, or acute encephalopathic etiology; however, chronic small vessel disease was noted. Given the time course of symptoms evolving over months, chronic progression of MS seems more likely. His family's concerns over low motivation and depression as well as chronic  de-conditioning may also be contributing to his weakness and decreased food intake. His fatigue and altered mental status may be contributed to by infection as below.   -- Neurology consult; appreciate reccs -- Will hold home tecfidera while NPO -- Will treat for possible infection as below -- PT/OT/SLP -- May consider need for psychiatry input  Tachycardia, leukocytosis: Tachycardic to 100s-110s, may reflect an infection, volume depletion, or pain. Afebrile with normal BP reassuring, but WBC count of 25 is concerning in combination with change in mental status. May be somewhat immunocompromised 2/2 chronic tecfidera use. Most likely source would be urinary based on UA and reassuring pulmonary exam. His WBC is elevated in the past in our system, though it looks like that was in the setting of steroid use.  -- UA w/ positive nitrite, trace LE, and many bacteria -- Will start empiric antibiotics with ceftriaxone for presumed UTI -- Will obtain urine cx, blood cx x2 -- Will give IVF NS @ 100 cc/hr x 10 hours -- Will check orthostatic vitals  Fall: Differential includes mechanical vs orthostasis vs cardiogenic. Normal glucose in the ED is reassuring. ECG is reassuring against arrhythmia. Blood pressures are stable, but tachycardia suggests possible infection vs decreased po intake contributing to orthostasis.  -- Will treat for possible infection as above -- Orthostatic vitals as above  Chronic Disease Mangement: Hypertension, hyperlipidemia, diabetes mellitus type 2. No recent A1c or cholesterol panel. Follows with [facility] for primary care needs.  -- Will hold home meds while NPO pending SLP eval  -- Hold home valsartan-HCTZ, low dose daily aspirin, atorvastatin, metformin and darifenacin -- On lantus at home; will hold in the setting of NPO -- Will check CBGs Q 4 hours   FEN/GI:  -- NPO pending SLP evaluation -- Holding home cholecalciferol  -- Constipation: Reassuring physical exam. May  consider stool softeners when advancing diet.   PPx:  --  DVT ppx: heparin TID -- GI ppx: holding home pantoprazole   Dispo:  -- Defer at this time; currently limited by ability to have po intake with failed swallow study -- Will need follow-up with his PCP at [facility]  This is a Psychologist, occupational Note.  The care of the patient was discussed with Dr. Delane Ginger and the assessment and plan was formulated with their assistance.  Please see their note for official documentation of the patient encounter.   Signed: [name], Med Student 06/03/2014, 4:07 PM  [phone]

## 2014-06-03 NOTE — ED Notes (Signed)
Jane,RN from [facility] called and would like pt brother to call her back. Dr. Shela Commons made aware and requests to speak with [facility] RN once family arrives.

## 2014-06-03 NOTE — ED Notes (Signed)
Spoke with Erskine Squibb, RN from [facility].

## 2014-06-03 NOTE — ED Notes (Signed)
Dr. Shela Commons informed pt failed swallow screen.

## 2014-06-03 NOTE — Progress Notes (Signed)
Call and got report from Marga Hoots, RN.  Will await for pt. To arrive to [facility] [facility].  Forbes Cellar, RN

## 2014-06-03 NOTE — ED Notes (Signed)
Phlebotomy and pharmacy at bedside.

## 2014-06-03 NOTE — ED Notes (Addendum)
Pt arrives from home via [facility]. Pt was found on the floor in the bathroom after falling out of his wheelchair. Pt states he has had right sided weakness x 2 months. Pt has a hx of MS and DM. At midnight pt began having slurred speech which has yet to resolve. Pt denies h/a, vision changes, dizziness or increased weakness. Pt reports he fell this morning trying to get from his wheelchair to the shower.

## 2014-06-03 NOTE — ED Notes (Signed)
Demetra Shiner (sister) [phone]

## 2014-06-03 NOTE — ED Notes (Signed)
Admitting at bedside 

## 2014-06-03 NOTE — Progress Notes (Signed)
ANTICOAGULATION CONSULT NOTE - Initial Consult  Pharmacy Consult for Rocephin Indication: UTI  No Known Allergies  Patient Measurements: Height: 5\' 8"  (172.7 cm) Weight: 167 lb (75.751 kg) IBW/kg (Calculated) : 68.4  Vital Signs: Temp: 100.3 F (37.9 C) (04/06 1857) Temp Source: Oral (04/06 1857) BP: 126/83 mmHg (04/06 1857) Pulse Rate: 98 (04/06 1857)  Labs:  Recent Labs  06/03/14 1050 06/03/14 1631  HGB 17.7* 15.9  HCT 52.0 44.5  PLT  --  214  CREATININE 0.80 0.78    Estimated Creatinine Clearance: 100.9 mL/min (by C-G formula based on Cr of 0.78).   Medical History: Past Medical History  Diagnosis Date  . MS (multiple sclerosis)   . Hypertension   . Hyperlipidemia   . Anemia   . Type II diabetes mellitus   . GERD (gastroesophageal reflux disease)   . Depression     Assessment: 7 YOM with hx of multiple sclerosis, diabetes, hypertension, and hyperlipidemia who presented with slurred speech, lethargy and leukocytosis (wbc = 25k), Temp 100.3. Pharmacy is consulted to start empiric rocephin for possible UTI.    Plan:  - Rocephin 1g IV Q 24 hrs - Pharmacy sign off.  Thanks!  Bayard Hugger, PharmD, BCPS  Clinical Pharmacist  Pager: [phone]   06/03/2014,8:18 PM

## 2014-06-03 NOTE — Consult Note (Signed)
Consult Reason for Consult:fall, ? MS exacerbation Referring Physician: Dr Heide Spark  CC: fall  HPI: Mario Shields is an 54 y.o. male with history of RRMS, HTN, DM presenting with fall and question of worsening slurred speech. Of note he is non-compliant with his MS medication (tecfidera). Per description, his fall consists of sliding out of bed and ending up on the floor. No LOC. While in the shower had further episode of slumping over where he was unable to support himself.   MS has had progressive decline since February. Currently unable to ambulate and requires assistance. Family has also noted decreased PO intake and difficulty swallowing over the past few months.   Labs pertinent for WBC of 25, UA showing many bacteria. Temp of 100.3 in ED. MRI brain reviewed, shows no acute lesion but progression since prior imaging in 2014.   Past Medical History  Diagnosis Date  . MS (multiple sclerosis)   . Hypertension   . Diabetes mellitus without complication   . Hyperlipidemia   . Anemia     Past Surgical History  Procedure Laterality Date  . Appendectomy      Family History  Problem Relation Age of Onset  . Cancer Father   . Diabetes    . Hypertension    . Heart disease    . Cancer      Social History:  reports that he has been smoking Cigarettes.  He has a 5 pack-year smoking history. He has never used smokeless tobacco. He reports that he does not drink alcohol or use illicit drugs.  No Known Allergies  Medications:  Scheduled: . aspirin EC  81 mg Oral Daily  . atorvastatin  40 mg Oral QHS  . buPROPion  150 mg Oral BID  . cefTRIAXone (ROCEPHIN)  IV  1 g Intravenous Q24H  . cholecalciferol  1,000 Units Oral Daily  . heparin  5,000 Units Subcutaneous 3 times per day  . hydrochlorothiazide  12.5 mg Oral Daily   And  . irbesartan  300 mg Oral Daily  . multivitamin with minerals  1 tablet Oral Daily  . [START ON 06/04/2014] pantoprazole  40 mg Oral Q0600   ROS: Out of a  complete 14 system review, the patient complains of only the following symptoms, and all other reviewed systems are negative. + weakness, fatigue  Physical Examination: Filed Vitals:   06/03/14 1857  BP: 126/83  Pulse: 98  Temp: 100.3 F (37.9 C)  Resp: 18   Physical Exam  Constitutional: He appears well-developed and well-nourished.  Psych: Affect appropriate to situation Eyes: No scleral injection HENT: No OP obstrucion Head: Normocephalic.  Cardiovascular: Normal rate and regular rhythm.  Respiratory: Effort normal and breath sounds normal.  GI: Soft. Bowel sounds are normal. No distension. There is no tenderness.  Skin: WDI  Neurologic Examination Lethargic but easily aroused to voice.  Oriented to name only. Follows all commands. Limited verbal output with dysarthria noted. Cranial Nerves: Fundoscopic exam reveals sharp disc margins. Pupils equal, briskly reactive to light. On right gaze left eye doesn't past midline and right eye abducts with no nystagmus. On left gaze right doesn't past midline and left abducts without nystagmus. Up and down gaze is normal Visual fields full to confrontation. Hearing intact and symmetric to finger snap. Facial sensation intact. R facial weakness with activation. Neck flexion and extension normal.  Motor: Flexed posture of RUE with limited movement, 4-4+/5 strength LUE though question effort. Unable to raise bilateral LE off bed,  distal 4 to 4+/5 though again question effort Sensory.: intact to touch decreased to pinprick to mid shin decreased vibratory and knees Coordination: unable to test Gait and Station: deferred Reflexes: 3+ and symmetric except 2+ at ankles. Toes downgoing.   Laboratory Studies:   Basic Metabolic Panel:  Recent Labs Lab 06/03/14 1050 06/03/14 1631  NA 141 140  K 3.7 3.8  CL 99 100  CO2  --  28  GLUCOSE 133* 97  BUN 8 6  CREATININE 0.80 0.78  CALCIUM  --  10.5    Liver Function Tests:  Recent  Labs Lab 06/03/14 1631  AST 22  ALT 37  ALKPHOS 116  BILITOT 1.0  PROT 7.8  ALBUMIN 4.4   No results for input(s): LIPASE, AMYLASE in the last 168 hours. No results for input(s): AMMONIA in the last 168 hours.  CBC:  Recent Labs Lab 06/03/14 1050 06/03/14 1631  WBC  --  25.0*  NEUTROABS  --  18.4*  HGB 17.7* 15.9  HCT 52.0 44.5  MCV  --  84.9  PLT  --  214    Cardiac Enzymes: No results for input(s): CKTOTAL, CKMB, CKMBINDEX, TROPONINI in the last 168 hours.  BNP: Invalid input(s): POCBNP  CBG:  Recent Labs Lab 06/03/14 1524  GLUCAP 101*    Microbiology: No results found for this or any previous visit.  Coagulation Studies: No results for input(s): LABPROT, INR in the last 72 hours.  Urinalysis:  Recent Labs Lab 06/03/14 1054  COLORURINE YELLOW  LABSPEC 1.010  PHURINE 6.0  GLUCOSEU NEGATIVE  HGBUR SMALL*  BILIRUBINUR NEGATIVE  KETONESUR NEGATIVE  PROTEINUR NEGATIVE  UROBILINOGEN 1.0  NITRITE POSITIVE*  LEUKOCYTESUR TRACE*    Lipid Panel:  No results found for: CHOL, TRIG, HDL, CHOLHDL, VLDL, LDLCALC  HgbA1C: No results found for: HGBA1C  Urine Drug Screen:  No results found for: LABOPIA, COCAINSCRNUR, LABBENZ, AMPHETMU, THCU, LABBARB  Alcohol Level: No results for input(s): ETH in the last 168 hours.  Other results:  Imaging: Mr Lodema Pilot Contrast  06/03/2014   CLINICAL DATA:  54 year old male with multiple sclerosis. Found down, fall from wheelchair. Right side weakness and slurred speech. Initial encounter.  EXAM: MRI HEAD WITHOUT AND WITH CONTRAST  TECHNIQUE: Multiplanar, multiecho pulse sequences of the brain and surrounding structures were obtained without and with intravenous contrast.  CONTRAST:  15 mL MultiHance.  COMPARISON:  Brain MRI 04/15/2012 and earlier.  FINDINGS: Mild generalized cerebral volume loss since 2014.  New since 2014 is moderate to severe T2 signal abnormality involving the left caudate nucleus and right  paracentral pons. These more resembles sequelae of small vessel ischemia then demyelinating disease, and there may be associated hemosiderin deposition at both sites.  However, there has been significant progression of abnormal T2 and FLAIR hyperintensity within the bilateral cerebellar peduncle is a which is typical of progressed demyelinating disease. There is also progressed indistinct T2 hyperintensity in the left paracentral pons. There is then significantly progressed bilateral cerebellar hemispheric T2 hyperintensity which could relate to either chronic lacunar infarcts or demyelinating disease. This is more severe on the left (series 4, image 7).  Advanced chronic supratentorial white matter T2 and FLAIR hyperintensity then has not significantly changed since 2014.  No restricted diffusion or evidence of acute infarction. Major intracranial vascular flow voids are stable. No abnormal enhancement identified.  No midline shift, mass effect, evidence of mass lesion, ventriculomegaly, extra-axial collection or acute intracranial hemorrhage. Cervicomedullary junction and pituitary are within  normal limits. 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 Grossly stable and negative visualized cervical spinal cord. Normal bone marrow signal.  Visible internal auditory structures appear normal. Mastoids are clear. Trace paranasal sinus mucosal thickening. Bilateral orbits soft tissues appear stable. Visualized scalp soft tissues are within normal limits.  IMPRESSION: 1. Progressed signal abnormality in the brain since 2014 which suggest progression of both chronic demyelinating disease, and interval small vessel ischemia. 2. No acute intracranial abnormality or active demyelinating disease identified.   Electronically Signed   By: Odessa Fleming M.D.   On: 06/03/2014 15:06     Assessment/Plan:  53y/o gentleman with history of RRMS, poorly compliant with tecfidera, presenting with fall and progressive  decline over the past few months. MRI brain shows no acute lesion. Suspect this likely represents a pseudo MS exacerbation likely related to underlying infectious process (WBC 25, UA +). Would hold on steroids at this point and treat the underlying infection.   -infectious workup and treatment per primary team -no indication for steroids at this time -PT/OT and speech therapy consult -will need outpatient neurology follow up. Counseled patient on importance of good compliance with his MS medications  Elspeth Cho, DO [facility] [phone]  If 7pm- 7am, please page neurology on call as listed in AMION. 06/03/2014, 7:15 PM

## 2014-06-03 NOTE — H&P (Signed)
Date: 06/03/2014               Patient Name:  Mario Shields MRN: [id number]  DOB: 1960-04-15 Age / Sex: 54 y.o., male   PCP: Leanna Battles, MD           Medical Service: Internal Medicine Teaching Service         Attending Physician: Dr. Aldine Contes, MD    First Contact: Dierdre Forth, [place]  Pager: [phone]-  Second Contact: Dr. Michail Jewels, MD Pager: [phone]        After Hours (After 5p/  First Contact Pager: [phone]  weekends / holidays): Second Contact Pager: [phone]    Most Recent Discharge Date:  04/19/12  Chief Complaint:  Chief Complaint  Patient presents with  . Weakness  . Fall       History of Present Illness:  Mario Shields is a 54 y.o. male who has a past medical history of MS (multiple sclerosis); Hypertension; Hyperlipidemia; Anemia; Type II diabetes mellitus; GERD (gastroesophageal reflux disease); and Depression.Marland Kitchen  Pt presents to the ED with slurred speech, generalized weakness, and a fall.  The history is primarily obtained from the patient's sister.  Briefly, Mario [facility]s is a [facility] and has a h/o MS.  Mario Shields was previously followed by a neurologist for his MS but now is managed by the [facility] physician.  Mario Shields apparently lives with his brother who is not present but his sister states Mario Shields recently began a medication for his MS that she states has made the slurred speech worse.  Mario Shields is primarily non-ambulatory although the sister states Mario Shields does not really try.  Mario Shields  had a fall out of bed last evening without any trauma which is what brought him to the ED along with worsening slurred speech.  [facility] and the other times Mario Shields is cared for by his brother.  She denies any other symptoms--no chest pain, SOB, fever/chills, abdominal pain, recent URI symptoms, or urinary symptoms.  She reports Mario Shields has frequent constipation sometimes going a month without a BM.  Mario Shields hs not been on prednisone since 2014.  She states the [facility] physician did not want to  start him on prednisone because it would make his blood sugars go up.  She says Mario Shields has been in PT but has been non-cooperative.  Sister reports poor po intake and frequent difficulty swallowing with frequent gagging.    MRI completed while in the ED showed progressed signal abnormality in the brain since 2014 which suggest progression of both chronic demyelinating disease and interval small vessel ischemia.  No acute abnormality or active demyelinating disease identified.  Mario Shields has a leukocytosis and UA suggestive of UTI.    Meds: Current Facility-Administered Medications  Medication Dose Route Frequency Provider Last Rate Last Dose  . 0.9 %  sodium chloride infusion   Intravenous Continuous Jones Bales, MD      . Derrill Memo ON 06/04/2014] antiseptic oral rinse (CPC / CETYLPYRIDINIUM CHLORIDE 0.05%) solution 7 mL  7 mL Mouth Rinse q12n4p Nischal Narendra, MD      . aspirin EC tablet 81 mg  81 mg Oral Daily Jones Bales, MD      . atorvastatin (LIPITOR) tablet 40 mg  40 mg Oral QHS Jones Bales, MD      . buPROPion [facility] SR) 12 hr tablet 150 mg  150 mg Oral BID Jones Bales, MD      .  cefTRIAXone (ROCEPHIN) 1 g in dextrose 5 % 50 mL IVPB - Premix  1 g Intravenous Q24H Nischal Narendra, MD      . chlorhexidine (PERIDEX) 0.12 % solution 15 mL  15 mL Mouth Rinse BID Nischal Narendra, MD      . cholecalciferol (VITAMIN D) tablet 1,000 Units  1,000 Units Oral Daily Jones Bales, MD      . gadobenate dimeglumine (MULTIHANCE) injection 15 mL  15 mL Intravenous Once PRN Medication Radiologist, MD      . heparin injection 5,000 Units  5,000 Units Subcutaneous 3 times per day Jones Bales, MD      . multivitamin with minerals tablet 1 tablet  1 tablet Oral Daily Jones Bales, MD      . Derrill Memo ON 06/04/2014] pantoprazole (PROTONIX) EC tablet 40 mg  40 mg Oral Q0600 Jones Bales, MD      . Derrill Memo ON 06/04/2014] pneumococcal 23 valent vaccine (PNU-IMMUNE) injection 0.5 mL  0.5 mL  Intramuscular Tomorrow-1000 Nischal Narendra, MD        Prescriptions prior to admission  Medication Sig Dispense Refill Last Dose  . aspirin EC 81 MG tablet Take 81 mg by mouth daily.   06/02/2014 at Unknown time  . atorvastatin (LIPITOR) 40 MG tablet Take 40 mg by mouth at bedtime.   06/02/2014 at Unknown time  . BAYER CONTOUR TEST test strip 1 each by Other route as needed.    unknown  . buPROPion (WELLBUTRIN SR) 150 MG 12 hr tablet Take 150 mg by mouth 2 (two) times daily.   06/02/2014 at Unknown time  . cholecalciferol (VITAMIN D) 1000 UNITS tablet Take 1,000 Units by mouth daily.   06/02/2014 at Unknown time  . Dimethyl Fumarate (TECFIDERA) 240 MG CPDR Take 1 capsule (240 mg total) by mouth 2 (two) times daily. 180 capsule 0 06/02/2014 at Unknown time  . ENABLEX 7.5 MG 24 hr tablet Take 7.5 mg by mouth daily.    06/02/2014 at Unknown time  . insulin glargine (LANTUS) 100 UNIT/ML injection Inject 30 Units into the skin daily. (Patient taking differently: Inject 50 Units into the skin daily. ) 10 mL 12 06/02/2014 at Unknown time  . metFORMIN (GLUCOPHAGE-XR) 500 MG 24 hr tablet Take 1,000 mg by mouth daily with breakfast.   06/02/2014 at Unknown time  . Multiple Vitamin (MULTIVITAMIN WITH MINERALS) TABS Take 1 tablet by mouth daily.   06/02/2014 at Unknown time  . pantoprazole (PROTONIX) 40 MG tablet Take 1 tablet (40 mg total) by mouth daily at 6 (six) AM. 30 tablet 12 06/02/2014 at Unknown time  . valsartan-hydrochlorothiazide (DIOVAN-HCT) 320-12.5 MG per tablet Take 1 tablet by mouth daily.   06/02/2014 at Unknown time  . buPROPion (WELLBUTRIN SR) 100 MG 12 hr tablet Take 100 mg by mouth daily.    Not Taking at Unknown time    Allergies: Allergies as of 06/03/2014  . (No Known Allergies)    PMH: Past Medical History  Diagnosis Date  . MS (multiple sclerosis)   . Hypertension   . Hyperlipidemia   . Anemia   . Type II diabetes mellitus   . GERD (gastroesophageal reflux disease)   . Depression      PSH: Past Surgical History  Procedure Laterality Date  . Appendectomy      FH: Family History  Problem Relation Age of Onset  . Cancer Father   . Diabetes    . Hypertension    . Heart disease    .  Cancer      SH: History  Substance Use Topics  . Smoking status: Current Every Day Smoker -- 0.50 packs/day for 40 years    Types: Cigarettes  . Smokeless tobacco: Never Used  . Alcohol Use: No    Review of Systems: Pertinent items are noted in HPI.  Physical Exam: BP 119/72 mmHg  Pulse 109  Temp(Src) 100.2 F (37.9 C) (Oral)  Resp 22  Ht 5' 8"  (1.727 m)  Wt 75.751 kg (167 lb)  BMI 25.40 kg/m2  SpO2 96%  Physical Exam Constitutional: Vital signs reviewed.  Patient is in NAD, appears drowsy.     Head: Normocephalic and atraumatic Eyes: EOMI, conjunctivae normal  Pharynx: Mucous membranes appear dry  Neck: Supple, trachea midline Cardiovascular: tachycardia, regular rhythm, no MRG Pulmonary/Chest: normal respiratory effort, CTAB, no wheezes, rales, or rhonchi Abdominal: Soft. Non-tender, non-distended, +BS Extremities: no C/C/E Neurological: A&O x3, CN II-XII are grossly intact, RUE strength 4/5, LUE 5/5.  Pt unable to move LE d/t poor effort.   Skin: Warm, dry and intact.    Lab results:  Basic Metabolic Panel:  Recent Labs  06/03/14 1050 06/03/14 1631  NA 141 140  K 3.7 3.8  CL 99 100  CO2  --  28  GLUCOSE 133* 97  BUN 8 6  CREATININE 0.80 0.78  CALCIUM  --  10.5    Calcium/Magnesium/Phosphorus:  Recent Labs Lab 06/03/14 1631  CALCIUM 10.5    Liver Function Tests:  Recent Labs  06/03/14 1631  AST 22  ALT 37  ALKPHOS 116  BILITOT 1.0  PROT 7.8  ALBUMIN 4.4   No results for input(s): LIPASE, AMYLASE in the last 72 hours. No results for input(s): AMMONIA in the last 72 hours.  CBC: Lab Results  Component Value Date   WBC 25.0* 06/03/2014   HGB 15.9 06/03/2014   HCT 44.5 06/03/2014   MCV 84.9 06/03/2014   PLT 214  06/03/2014    Lipase: No results found for: LIPASE  Lactic Acid/Procalcitonin: No results for input(s): LATICACIDVEN, PROCALCITON, O2SATVEN in the last 168 hours.  Cardiac Enzymes: No results for input(s): TROPIPOC in the last 72 hours. No results found for: CKTOTAL, CKMB, CKMBINDEX, TROPONINI  BNP: No results for input(s): PROBNP in the last 72 hours.  D-Dimer: No results for input(s): DDIMER in the last 72 hours.  CBG:  Recent Labs  06/03/14 1524 06/03/14 1854  GLUCAP 101* 118*    Hemoglobin A1C: No results for input(s): HGBA1C in the last 72 hours.  Lipid Panel: No results for input(s): CHOL, HDL, LDLCALC, TRIG, CHOLHDL, LDLDIRECT in the last 72 hours.  Thyroid Function Tests: No results for input(s): TSH, T4TOTAL, FREET4, T3FREE, THYROIDAB in the last 72 hours.  Anemia Panel: No results for input(s): VITAMINB12, FOLATE, FERRITIN, TIBC, IRON, RETICCTPCT in the last 72 hours.  Coagulation: No results for input(s): LABPROT, INR in the last 72 hours.  Urine Drug Screen: Drugs of Abuse:  No results found for: LABOPIA, COCAINSCRNUR, LABBENZ, AMPHETMU, THCU, LABBARB  Alcohol Level: No results for input(s): ETH in the last 72 hours.  Urinalysis:    Component Value Date/Time   COLORURINE YELLOW 06/03/2014 1054   APPEARANCEUR CLOUDY* 06/03/2014 1054   LABSPEC 1.010 06/03/2014 1054   PHURINE 6.0 06/03/2014 1054   GLUCOSEU NEGATIVE 06/03/2014 1054   HGBUR SMALL* 06/03/2014 1054   [place] 06/03/2014 1054   [place] 06/03/2014 1054   PROTEINUR NEGATIVE 06/03/2014 1054   UROBILINOGEN 1.0 06/03/2014 1054  NITRITE POSITIVE* 06/03/2014 1054   LEUKOCYTESUR TRACE* 06/03/2014 1054    Imaging results:  Mr Kizzie Fantasia Contrast  06/03/2014   CLINICAL DATA:  54 year old male with multiple sclerosis. Found down, fall from wheelchair. Right side weakness and slurred speech. Initial encounter.  EXAM: MRI HEAD WITHOUT AND WITH CONTRAST  TECHNIQUE:  Multiplanar, multiecho pulse sequences of the brain and surrounding structures were obtained without and with intravenous contrast.  CONTRAST:  15 mL MultiHance.  COMPARISON:  Brain MRI 04/15/2012 and earlier.  FINDINGS: Mild generalized cerebral volume loss since 2014.  New since 2014 is moderate to severe T2 signal abnormality involving the left caudate nucleus and right paracentral pons. These more resembles sequelae of small vessel ischemia then demyelinating disease, and there may be associated hemosiderin deposition at both sites.  However, there has been significant progression of abnormal T2 and FLAIR hyperintensity within the bilateral cerebellar peduncle is a which is typical of progressed demyelinating disease. There is also progressed indistinct T2 hyperintensity in the left paracentral pons. There is then significantly progressed bilateral cerebellar hemispheric T2 hyperintensity which could relate to either chronic lacunar infarcts or demyelinating disease. This is more severe on the left (series 4, image 7).  Advanced chronic supratentorial white matter T2 and FLAIR hyperintensity then has not significantly changed since 2014.  No restricted diffusion or evidence of acute infarction. Major intracranial vascular flow voids are stable. No abnormal enhancement identified.  No midline shift, mass effect, evidence of mass lesion, ventriculomegaly, extra-axial collection or acute intracranial hemorrhage. Cervicomedullary junction and pituitary are within normal limits. 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 Grossly stable and negative visualized cervical spinal cord. Normal bone marrow signal.  Visible internal auditory structures appear normal. Mastoids are clear. Trace paranasal sinus mucosal thickening. Bilateral orbits soft tissues appear stable. Visualized scalp soft tissues are within normal limits.  IMPRESSION: 1. Progressed signal abnormality in the brain since 2014 which  suggest progression of both chronic demyelinating disease, and interval small vessel ischemia. 2. No acute intracranial abnormality or active demyelinating disease identified.   Electronically Signed   By: Genevie Ann M.D.   On: 06/03/2014 15:06    EKG: EKG Interpretation  Date/Time:  Wednesday June 03 2014 10:14:36 EDT Ventricular Rate:  114 PR Interval:  183 QRS Duration: 90 QT Interval:  332 QTC Calculation: 457 R Axis:   4 Text Interpretation:  Sinus tachycardia Probable left atrial enlargement Inferior infarct, old SINCE LAST TRACING HEART RATE HAS INCREASED Confirmed by Winfred Leeds  MD, SAM [phone]) on 06/03/2014 4:36:31 PM   Antibiotics: Antibiotics Given (last 72 hours)    None      Anti-infectives    Start     Dose/Rate Route Frequency Ordered Stop   06/03/14 2000  cefTRIAXone (ROCEPHIN) 1 g in dextrose 5 % 50 mL IVPB - Premix     1 g 100 mL/hr over 30 Minutes Intravenous Every 24 hours 06/03/14 1936        SIRS/Sepsis/Septic Shock criteria met:  Pt meets sepsis criteria.    Consults: Treatment Team:  Catarina Hartshorn, MD  Assessment & Plan by Problem: Principal Problem:   Infection of urinary tract Active Problems:   Diabetes mellitus   Essential hypertension, benign   Multiple sclerosis, relapsing-remitting   Pt is a 54 y.o.  has a past medical history of MS (multiple sclerosis); Hypertension; Hyperlipidemia; Anemia; Type II diabetes mellitus; GERD (gastroesophageal reflux disease); and Depression. presents to ED with slurred speech, generalized weakness, and  dysphagia.    Sepsis likely due to UTI UA suggests UTI and likely sources of generalized weakness and leukocytosis.  Lungs are CTAB so PNA unlikely source of sepsis and will defer CXR for now.   -start ceftriaxone -IVF -Blood and urine cx -d/c all anticholinergic meds that may be contributing   RRMS Pt has not seen a neurologist in a while and has been followed by the [facility].  Has not been on  prednisone since 2014.  Mario Shields is on tecfidera for MS which Mario Shields recently restarted.  But his sister states this causes slurred speech.  Mario Shields is also on enablex for bladder spasms.  -will hold tecfidera for now given sister's concerns -consult neurology  -PT/OT, SLP  Dysphagia Pt failed initial RN bedside swallow study in the ED  -consult SLP  -keep NPO overnight until eval  Hypertension  Stable.  -will hold home meds tonight given sepsis    Diabetes Mellitus II  Pt on metformin 1015m/day and lantus for home meds.   -d/c metformin and lantus  -SSI-S, ac and hs cbg  GERD  Pt on chronic PPI at home. -continue home meds  Dyslipidemia  Pt is on lipitor [id number] at home.   -continue statin therapy  Substance abuse  Pt has a h/o tobacco use  Fall  Unclear if syncopal episode.  Has h/o prior falls.  No known h/o seizures. No cardiac history.  Denies dizziness, lightheadedness, palpitations, diaphoresis, N/V.  Unfortunately orthostatics were not checked prior to IVF.   -admit to telemetry -EKG, trop x 1 -orthostatics, UDS  FEN  Fluids-NS  Electrolytes-replete PRN  Nutrition- NPO until swallow eval   VTE prophylaxis  5000 Units Heparin SQ tid  Disposition Disposition deferred at this time, awaiting improvement of current medical problems. Anticipated discharge in approximately 1-2 day(s).    Emergency Contact Contact Information    Name Relation Home Work MoWasecarother   [phone]   Signed JaJones BalesMD PGY-2, Internal Medicine Teaching Service 06/03/2014, 10:04 PM

## 2014-06-03 NOTE — ED Notes (Addendum)
Pt has multiple sclerosis and has had right sided weakness with slurred speech x2 months. Pt was attempting to get from his wheelchair into the shower this morning and fell in transit. Pt has no new complaints at this time.

## 2014-06-03 NOTE — ED Notes (Signed)
Attempted report to 5W. 

## 2014-06-03 NOTE — Progress Notes (Signed)
Mario Shields [id number] Admitted to 5W 35: 06/03/2014 7:04 PM Attending Provider: Earl Lagos, MD    Mario Shields is a 54 y.o. male patient admitted from ED, from ;home with brother & sister, awake, alert  & orientated  X 3,  Full Code, VSS - Blood pressure 126/83, pulse 98, temperature 100.3 F (37.9 C), temperature source Oral, resp. rate 18, height 5\' 8"  (1.727 m), weight 75.751 kg (167 lb), SpO2 99 %., R/A, no c/o shortness of breath, no c/o chest pain, no distress noted. Non-Tele..   IV site WDL:  antecubital left, condition patent and no redness with a transparent dsg that's clean dry and intact.  Allergies:  No Known Allergies   Past Medical History  Diagnosis Date  . MS (multiple sclerosis)   . Hypertension   . Diabetes mellitus without complication   . Hyperlipidemia   . Anemia     History:  Will be obtained from the patient.  Pt orientation to unit, room and routine. Information packet given to patient/family.  Admission INP armband ID verified with patient/family, and in place. SR up x 2, fall risk assessment complete with Patient and family verbalizing understanding of risks associated with falls. Pt verbalizes an understanding of how to use the call bell and to call for help before getting out of bed.  Skin, clean-dry- intact without evidence of bruising, or skin tears.   No evidence of skin break down noted on exam.    Will cont to monitor and assist as needed.  Joana Reamer, RN 06/03/2014 7:04 PM

## 2014-06-03 NOTE — ED Notes (Signed)
Dr. Rennis Chris talking with family.

## 2014-06-04 ENCOUNTER — Observation Stay (HOSPITAL_COMMUNITY): Payer: Medicare (Managed Care)

## 2014-06-04 DIAGNOSIS — F0390 Unspecified dementia without behavioral disturbance: Secondary | ICD-10-CM | POA: Diagnosis present

## 2014-06-04 DIAGNOSIS — Z833 Family history of diabetes mellitus: Secondary | ICD-10-CM | POA: Diagnosis not present

## 2014-06-04 DIAGNOSIS — Z8249 Family history of ischemic heart disease and other diseases of the circulatory system: Secondary | ICD-10-CM | POA: Diagnosis not present

## 2014-06-04 DIAGNOSIS — Z7982 Long term (current) use of aspirin: Secondary | ICD-10-CM | POA: Diagnosis not present

## 2014-06-04 DIAGNOSIS — N3289 Other specified disorders of bladder: Secondary | ICD-10-CM | POA: Diagnosis present

## 2014-06-04 DIAGNOSIS — Z79899 Other long term (current) drug therapy: Secondary | ICD-10-CM | POA: Diagnosis not present

## 2014-06-04 DIAGNOSIS — Z9181 History of falling: Secondary | ICD-10-CM | POA: Diagnosis not present

## 2014-06-04 DIAGNOSIS — A419 Sepsis, unspecified organism: Secondary | ICD-10-CM | POA: Diagnosis present

## 2014-06-04 DIAGNOSIS — N390 Urinary tract infection, site not specified: Secondary | ICD-10-CM | POA: Diagnosis present

## 2014-06-04 DIAGNOSIS — K5900 Constipation, unspecified: Secondary | ICD-10-CM | POA: Diagnosis present

## 2014-06-04 DIAGNOSIS — R1310 Dysphagia, unspecified: Secondary | ICD-10-CM | POA: Diagnosis present

## 2014-06-04 DIAGNOSIS — G35 Multiple sclerosis: Secondary | ICD-10-CM | POA: Diagnosis present

## 2014-06-04 DIAGNOSIS — I10 Essential (primary) hypertension: Secondary | ICD-10-CM | POA: Diagnosis present

## 2014-06-04 DIAGNOSIS — E119 Type 2 diabetes mellitus without complications: Secondary | ICD-10-CM | POA: Diagnosis present

## 2014-06-04 DIAGNOSIS — Z9119 Patient's noncompliance with other medical treatment and regimen: Secondary | ICD-10-CM | POA: Diagnosis present

## 2014-06-04 DIAGNOSIS — F17210 Nicotine dependence, cigarettes, uncomplicated: Secondary | ICD-10-CM | POA: Diagnosis present

## 2014-06-04 DIAGNOSIS — E785 Hyperlipidemia, unspecified: Secondary | ICD-10-CM | POA: Diagnosis present

## 2014-06-04 DIAGNOSIS — R4781 Slurred speech: Secondary | ICD-10-CM | POA: Diagnosis present

## 2014-06-04 DIAGNOSIS — Z794 Long term (current) use of insulin: Secondary | ICD-10-CM | POA: Diagnosis not present

## 2014-06-04 DIAGNOSIS — F329 Major depressive disorder, single episode, unspecified: Secondary | ICD-10-CM | POA: Diagnosis present

## 2014-06-04 DIAGNOSIS — K219 Gastro-esophageal reflux disease without esophagitis: Secondary | ICD-10-CM | POA: Diagnosis present

## 2014-06-04 DIAGNOSIS — M25511 Pain in right shoulder: Secondary | ICD-10-CM | POA: Diagnosis present

## 2014-06-04 LAB — CBC
HEMATOCRIT: 41.8 % (ref 39.0–52.0)
HEMOGLOBIN: 14.5 g/dL (ref 13.0–17.0)
MCH: 30.1 pg (ref 26.0–34.0)
MCHC: 34.7 g/dL (ref 30.0–36.0)
MCV: 86.9 fL (ref 78.0–100.0)
Platelets: 205 10*3/uL (ref 150–400)
RBC: 4.81 MIL/uL (ref 4.22–5.81)
RDW: 13.4 % (ref 11.5–15.5)
WBC: 26.3 10*3/uL — AB (ref 4.0–10.5)

## 2014-06-04 LAB — RAPID URINE DRUG SCREEN, HOSP PERFORMED
Amphetamines: NOT DETECTED
BARBITURATES: NOT DETECTED
Benzodiazepines: NOT DETECTED
Cocaine: NOT DETECTED
OPIATES: NOT DETECTED
TETRAHYDROCANNABINOL: NOT DETECTED

## 2014-06-04 LAB — GLUCOSE, CAPILLARY
GLUCOSE-CAPILLARY: 121 mg/dL — AB (ref 70–99)
GLUCOSE-CAPILLARY: 98 mg/dL (ref 70–99)
Glucose-Capillary: 118 mg/dL — ABNORMAL HIGH (ref 70–99)
Glucose-Capillary: 126 mg/dL — ABNORMAL HIGH (ref 70–99)
Glucose-Capillary: 155 mg/dL — ABNORMAL HIGH (ref 70–99)
Glucose-Capillary: 155 mg/dL — ABNORMAL HIGH (ref 70–99)

## 2014-06-04 LAB — TROPONIN I: Troponin I: <0.03 ng/mL (ref <0.031)

## 2014-06-04 MED ORDER — DOCUSATE SODIUM 100 MG PO CAPS
100.0000 mg | ORAL_CAPSULE | Freq: Two times a day (BID) | ORAL | Status: DC | PRN
  Administered 2014-06-06: 100 mg via ORAL
  Filled 2014-06-04: qty 1

## 2014-06-04 MED ORDER — INSULIN ASPART 100 UNIT/ML ~~LOC~~ SOLN
0.0000 [IU] | Freq: Three times a day (TID) | SUBCUTANEOUS | Status: DC
  Administered 2014-06-04: 2 [IU] via SUBCUTANEOUS
  Administered 2014-06-05 (×3): 1 [IU] via SUBCUTANEOUS
  Administered 2014-06-06: 2 [IU] via SUBCUTANEOUS
  Administered 2014-06-06 – 2014-06-07 (×2): 1 [IU] via SUBCUTANEOUS

## 2014-06-04 MED ORDER — SODIUM CHLORIDE 0.9 % IV SOLN: INTRAVENOUS | Status: AC

## 2014-06-04 MED ORDER — ACETAMINOPHEN 325 MG PO TABS
650.0000 mg | ORAL_TABLET | Freq: Once | ORAL | Status: AC
  Administered 2014-06-04: 650 mg via ORAL
  Filled 2014-06-04: qty 2

## 2014-06-04 MED ORDER — DIMETHYL FUMARATE 240 MG PO CPDR
1.0000 | DELAYED_RELEASE_CAPSULE | Freq: Two times a day (BID) | ORAL | Status: DC
  Administered 2014-06-05 – 2014-06-07 (×4): 240 mg via ORAL
  Filled 2014-06-04: qty 1

## 2014-06-04 MED ORDER — SODIUM CHLORIDE 0.9 % IV SOLN
INTRAVENOUS | Status: AC
  Administered 2014-06-04 – 2014-06-05 (×2): via INTRAVENOUS

## 2014-06-04 NOTE — Evaluation (Signed)
Physical Therapy Evaluation Patient Details Name: Mario Shields MRN: [id number] DOB: 1961-02-02 Today's Date: 06/04/2014   History of Present Illness  :Mr. Ellinwood is a 54 yo man with a PMH significant for multiple sclerosis, hypertension, hyperlipidemia, and diabetes mellitus who presents for further evaluation of slurred speech and a fall. Pt/family report decline in mobility for the past 3 months.   Clinical Impression  Pt admitted with above diagnosis. Pt currently with functional limitations due to the deficits listed below (see PT Problem List). For the past 3 months pt has required total assist for bed to [facility] transfer, prior to this he was able to independently transfer. +2 assist on PT eval for supine to sit, max assist for sitting balance initially, then min assist. Pt has R hand contracture to be further evaluated by OT and B foot drop. PRAFOs recommended to prevent ankle contractures. Pt is agreeable to ST-SNF stay which would be beneficial.  Pt will benefit from skilled PT to increase their independence and safety with mobility to allow discharge to the venue listed below.       Follow Up Recommendations SNF    Equipment Recommendations  Other (comment) Michiel Sites if DCing home, PRAFOs to prevent ankle contractures)    Recommendations for Other Services       Precautions / Restrictions Precautions Precautions: Fall Precaution Comments: pt slid OOB just prior to admission, pt/family report no other falls in past year Restrictions Weight Bearing Restrictions: No      Mobility  Bed Mobility Overal bed mobility: +2 for physical assistance;Needs Assistance Bed Mobility: Rolling;Sidelying to Sit;Sit to Supine Rolling: +2 for physical assistance;Total assist Sidelying to sit: +2 for physical assistance;Total assist   Sit to supine: Total assist;+2 for physical assistance   General bed mobility comments: verbal and manual cues for technique, pt 10% for sidelying to sit, 15% for  rolling L  Transfers                 General transfer comment: NT -pt fatigued after sitting on EOB  Ambulation/Gait                Stairs            Wheelchair Mobility    Modified Rankin (Stroke Patients Only)       Balance Overall balance assessment: Needs assistance Sitting-balance support: Bilateral upper extremity supported Sitting balance-Leahy Scale: Zero Sitting balance - Comments: total assist initially due to posterior lean, then min guard assist, pt able to maintain neutral position for 2 minutes with BUE support and verbal cues to lean forward, pt sat on EOB x 5 minutes Postural control: Left lateral lean;Posterior lean                                   Pertinent Vitals/Pain Pain Assessment: Faces Pain Score: 4  Pain Location: R hand  with finger movement Pain Descriptors / Indicators: Contraction Pain Intervention(s): Monitored during session;Limited activity within patient's tolerance    Home Living Family/patient expects to be discharged to:: Private residence Living Arrangements: Other (Comment) (brother and sister) Available Help at Discharge: Available 24 hours/day;Family Type of Home: House Home Access: Ramped entrance       Home Equipment: Bedside commode;Wheelchair - power;Walker - 2 wheels;Shower seat      Prior Function Level of Independence: Needs assistance   Gait / Transfers Assistance Needed: total assist to transfer to [facility] for  past 3 months, prior to that pt was able to independently transfer, steers power WC independently  ADL's / Homemaking Assistance Needed: assist to transfer into walk in shower        Hand Dominance   Dominant Hand: Right    Extremity/Trunk Assessment   Upper Extremity Assessment: RUE deficits/detail;LUE deficits/detail RUE Deficits / Details: 3-5th finger flexion contractures, pain with attempted passive shoulder elevation, OT to assess further         Lower Extremity  Assessment: RLE deficits/detail;LLE deficits/detail RLE Deficits / Details: ankle 0/5, PROM WFL, knee ext 1/5, hip -2/5 with PROM WFL, sensation intact to light touch LLE Deficits / Details: ankle 1/5, knee ext 1/5, hip 1/5, PROM WFL thoughout, sensation intact   Cervical / Trunk Assessment: Normal  Communication   Communication: No difficulties  Cognition Arousal/Alertness: Awake/alert Behavior During Therapy: Flat affect Overall Cognitive Status: Within Functional Limits for tasks assessed                      General Comments      Exercises        Assessment/Plan    PT Assessment Patient needs continued PT services  PT Diagnosis Generalized weakness   PT Problem List Decreased strength;Decreased range of motion;Decreased activity tolerance;Decreased balance;Decreased mobility;Pain  PT Treatment Interventions Functional mobility training;Therapeutic activities;Patient/family education;Balance training;Therapeutic exercise   PT Goals (Current goals can be found in the Care Plan section) Acute Rehab PT Goals Patient Stated Goal: to get stronger PT Goal Formulation: With patient/family Time For Goal Achievement: 06/18/14 Potential to Achieve Goals: Fair    Frequency Min 3X/week   Barriers to discharge        Co-evaluation               End of Session   Activity Tolerance: Patient limited by fatigue Patient left: in bed;with call bell/phone within reach;with family/visitor present Nurse Communication: Mobility status;Need for lift equipment    Functional Assessment Tool Used: clinical judgement Functional Limitation: Mobility: Walking and moving around Mobility: Walking and Moving Around Current Status [phone]): At least 80 percent but less than 100 percent impaired, limited or restricted Mobility: Walking and Moving Around Goal Status [phone]): At least 60 percent but less than 80 percent impaired, limited or restricted    Time: 0947-1023 PT Time  Calculation (min) (ACUTE ONLY): 36 min   Charges:   PT Evaluation $Initial PT Evaluation Tier I: 1 Procedure PT Treatments $Therapeutic Activity: 8-22 mins   PT G Codes:   PT G-Codes **NOT FOR INPATIENT CLASS** Functional Assessment Tool Used: clinical judgement Functional Limitation: Mobility: Walking and moving around Mobility: Walking and Moving Around Current Status ([id number]): At least 80 percent but less than 100 percent impaired, limited or restricted Mobility: Walking and Moving Around Goal Status [phone]): At least 60 percent but less than 80 percent impaired, limited or restricted    Tamala Ser 06/04/2014, 10:47 AM [phone]

## 2014-06-04 NOTE — Clinical Social Work Placement (Cosign Needed)
Clinical Social Work Department CLINICAL SOCIAL WORK PLACEMENT NOTE 06/04/2014  Patient:  Mario Shields, Mario Shields  Account Number:  [id number] Admit date:  06/03/2014  Clinical Social Worker:  Corlis Hove, CLINICAL SOCIAL WORKER  Date/time:  06/04/2014 10:00 PM  Clinical Social Work is seeking post-discharge placement for this patient at the following level of care:   SKILLED NURSING   (*CSW will update this form in Epic as items are completed)   06/04/2014  Patient/family provided with [facility] Department of Clinical Social Work's list of facilities offering this level of care within the geographic area requested by the patient (or if unable, by the patient's family).  06/04/2014  Patient/family informed of their freedom to choose among providers that offer the needed level of care, that participate in Medicare, Medicaid or managed care program needed by the patient, have an available bed and are willing to accept the patient.  06/04/2014  Patient/family informed of [facility]' ownership interest in [facility], as well as of the fact that they are under no obligation to receive care at this facility.  PASARR submitted to EDS on 06/04/2014 PASARR number received on 06/04/2014  FL2 transmitted to all facilities in geographic area requested by pt/family on  06/04/2014 FL2 transmitted to all facilities within larger geographic area on 06/04/2014  Patient informed that his/her managed care company has contracts with or will negotiate with  certain facilities, including the following:     Patient/family informed of bed offers received:   Patient chooses bed at  Physician recommends and patient chooses bed at    Patient to be transferred to  on   Patient to be transferred to facility by  Patient and family notified of transfer on  Name of family member notified:    The following physician request were entered in Epic:   Additional Comments:

## 2014-06-04 NOTE — Clinical Social Work Psychosocial (Signed)
Clinical Social Work Department BRIEF PSYCHOSOCIAL ASSESSMENT 06/04/2014  Patient:  Mario Shields, Mario Shields     Account Number:  [id number]     Admit date:  06/03/2014  Clinical Social Worker:  Stacy Gardner, CLINICAL SOCIAL WORKER  Date/Time:  06/04/2014 02:45 PM  Referred by:  Physician  Date Referred:  06/04/2014 Referred for  SNF Placement   Other Referral:   N/A   Interview type:  Patient Other interview type:   N/A    PSYCHOSOCIAL DATA Living Status:  SIBLING Admitted from facility:   Level of care:   Primary support name:  Arlys John Primary support relationship to patient:  SIBLING Degree of support available:   Support is good.    CURRENT CONCERNS Current Concerns  Post-Acute Placement   Other Concerns:   N/A    SOCIAL WORK ASSESSMENT / PLAN BSW intern spoke with patient at bed side regarding plans for discharge. Patient was not engaged and did not seem interested in talking to BSW. Patient is current from home with brother and sister where they both are his main support system. He mentioned wanting to return back home but when SNF was mentioned in our conversation he acted as though that was the better option. Patient stated that he did not care where he went multiple times in our conversation. When BSW asked why he did not care he stated "there is nothing to care about". BSW will send out patient's information to SNF's in [facility] and discuss options in the moring.   Assessment/plan status:  Psychosocial Support/Ongoing Assessment of Needs Other assessment/ plan:   N/A   Information/referral to community resources:   CSW contact information was given to patient.    PATIENT'S/FAMILY'S RESPONSE TO PLAN OF CARE: Patient was not interactive during conversation and stated multiple times that he did not care where he was placed. BSW/ CSW will look for short term stay in [place] SNF's. Ongoing assessment may be needed.      Stacy Gardner, BSW Intern,  [phone]

## 2014-06-04 NOTE — Progress Notes (Signed)
Subjective: Denies any pain this AM. Endorses thirst and would like to drink but is NPO due to failed speech evaluation.Denies any difficulty with swallowing.   Objective: Vital signs in last 24 hours: Filed Vitals:   06/03/14 1800 06/03/14 1857 06/03/14 2152 06/04/14 0559  BP: 109/77 126/83 119/72 120/79  Pulse: 107 98 109 105  Temp:  100.3 F (37.9 C) 100.2 F (37.9 C) 100.3 F (37.9 C)  TempSrc:  Oral Oral Oral  Resp: 25 18 22 22   Height:      Weight:      SpO2: 97% 99% 96% 94%   Weight change:   Intake/Output Summary (Last 24 hours) at 06/04/14 0957 Last data filed at 06/04/14 0212  Gross per 24 hour  Intake      0 ml  Output    150 ml  Net   -150 ml   General: Lying in bed in no acute distress.  CV: RRR Pulm: Difficult pulmonary exam 2/2 patient weakness. Normal work of breathing; CTAB.  Abdomen: Normal active bowel sounds.  Neuro:  Mental Status: Alert, oriented to place and president. For time, states March 2015. Perseverates on drinking fluids.  Cranial Nerves: Baseline dysconjugate gaze; right eye with impaired medial movements. Facial asymmetry at rest. No weakness detected in orbicularis oculi, frontalis, or with activation of smile.  Motor: 4+/5 biceps and triceps bilaterally. 3-4/5 dorsiflexion and plantarflexion in bilateral lower extremities.   Lab Results: Basic Metabolic Panel:  Recent Labs  60/73/71 1050 06/03/14 1631  NA 141 140  K 3.7 3.8  CL 99 100  CO2  --  28  GLUCOSE 133* 97  BUN 8 6  CREATININE 0.80 0.78  CALCIUM  --  10.5   Liver Function Tests:  Recent Labs  06/03/14 1631  AST 22  ALT 37  ALKPHOS 116  BILITOT 1.0  PROT 7.8  ALBUMIN 4.4   No results for input(s): LIPASE, AMYLASE in the last 72 hours. No results for input(s): AMMONIA in the last 72 hours. CBC:  Recent Labs  06/03/14 1631 06/04/14 0542  WBC 25.0* 26.3*  NEUTROABS 18.4*  --   HGB 15.9 14.5  HCT 44.5 41.8  MCV 84.9 86.9  PLT 214 205   Cardiac  Enzymes:  Recent Labs  06/03/14 2330  TROPONINI <0.03  CBG:  Recent Labs  06/03/14 1524 06/03/14 1854 06/03/14 2147 06/04/14 0016 06/04/14 0422 06/04/14 0755  GLUCAP 101* 118* 124* 155* 121* 98   Urine Drug Screen: Drugs of Abuse     Component Value Date/Time   LABOPIA NONE DETECTED 06/03/2014 1054   COCAINSCRNUR NONE DETECTED 06/03/2014 1054   LABBENZ NONE DETECTED 06/03/2014 1054   AMPHETMU NONE DETECTED 06/03/2014 1054   THCU NONE DETECTED 06/03/2014 1054   LABBARB NONE DETECTED 06/03/2014 1054    Urinalysis:  Recent Labs  06/03/14 1054  COLORURINE YELLOW  LABSPEC 1.010  PHURINE 6.0  GLUCOSEU NEGATIVE  HGBUR SMALL*  BILIRUBINUR NEGATIVE  KETONESUR NEGATIVE  PROTEINUR NEGATIVE  UROBILINOGEN 1.0  NITRITE POSITIVE*  LEUKOCYTESUR TRACE*   Micro Results: Reviewed in EPIC. Urine culture and blood cultures from 4/6 pending.   Medications: Scheduled Meds: . antiseptic oral rinse  7 mL Mouth Rinse q12n4p  . aspirin EC  81 mg Oral Daily  . atorvastatin  40 mg Oral QHS  . buPROPion  150 mg Oral BID  . cefTRIAXone (ROCEPHIN)  IV  1 g Intravenous Q24H  . chlorhexidine  15 mL Mouth Rinse BID  . cholecalciferol  1,000 Units Oral Daily  . heparin  5,000 Units Subcutaneous 3 times per day  . multivitamin with minerals  1 tablet Oral Daily  . pantoprazole  40 mg Oral Q0600  . pneumococcal 23 valent vaccine  0.5 mL Intramuscular Tomorrow-1000   Continuous Infusions: . sodium chloride 125 mL/hr at 06/04/14 0020   PRN Meds:.None  Assessment/Plan: Mario Shields is a 54 yo male with a PMH relevant for multiple sclerosis, diabetes, hypertension, and hyperlipidemia who presents to [facility] for further evaluation management of slurred speech, weakness, lethargy, and probable UTI.   Principal Problem:   Sepsis due to urinary tract infection Active Problems:   Diabetes mellitus   Essential hypertension, benign   Dyslipidemia   Multiple sclerosis,  relapsing-remitting  Tachycardia, leukocytosis, probable UTI: Continued tachycardia and leukocytosis. Almost febrile, but no fevers yet. Most likely source would be urinary based on UA and reassuring pulmonary exam.  -- UA w/ positive nitrite, trace LE, and many bacteria; urine culture pending -- Continue ceftriaxone pending cultures and sensitivities  -- Blood cultures pending -- Will continue IVF NS at 75 cc/hr while NPO  Multiple sclerosis, now with weakness, slurred Speech, AMS: History of sub-acute slurred speech, AMS, and progressive dysarthria on top of chronic changes including decreased po intake. His MRI is concerning for progression of his MS, but acute flare is unlikely given MRI and per neurology recommendations. Per family's concerns, depression and decreased motivation may be playing a role in his chronic weakness and deconditioning.  -- Neurology consult; appreciate reccs -- Will hold home tecfidera while NPO -- Will treat for possible infection as above -- SLP recommends NPO pending modified barium swallow -- PT/OT -- Would likely benefit from outpatient psychiatric follow-up   Fall: Most likely mechanical in the setting of weakness and probable UTI.  -- Will treat for possible infection as above  Chronic Disease Mangement: Hypertension, hyperlipidemia, diabetes mellitus type 2. No recent A1c or cholesterol panel. Follows with [facility] for primary care needs.  -- Will hold home meds while NPO -- Hold home valsartan-HCTZ, low dose daily aspirin, atorvastatin, metformin and darifenacin -- On lantus at home; will hold in the setting of NPO -- Will check CBGs Q 4 hours; 98-155 overnight  FEN/GI:  -- NPO pending completed SLP evaluation -- Holding home cholecalciferol  -- Constipation: Reassuring physical exam. May consider stool softeners when advancing diet.   PPx:  -- DVT ppx: heparin TID -- GI ppx: holding home pantoprazole   Dispo:  -- Will need follow-up with his  PCP at [facility], with Dr. Marjory Lies with neurology, and likely with psychiatry   This is a Medical Student Note.  The care of the patient was discussed with Dr. Delane Ginger and the assessment and plan formulated with their assistance.  Please see their attached note for official documentation of the daily encounter.   Mario Shields, Med Student 06/04/2014, 9:57 AM  [phone]

## 2014-06-04 NOTE — Progress Notes (Signed)
Speech Pathology     MBSS complete. Full report located under chart review in imaging section.     Delvon Chipps Willis Nelva Hauk M.Ed CCC-SLP Pager [phone]   

## 2014-06-04 NOTE — Evaluation (Signed)
Occupational Therapy Evaluation Patient Details Name: Mario Shields MRN: [id number] DOB: [birth year] Today's Date: 06/04/2014    History of Present Illness Mario Shields is a 54 yo man with a PMH significant for multiple sclerosis, hypertension, hyperlipidemia, and diabetes mellitus who presents for further evaluation of slurred speech and a fall. Pt/family report decline in mobility for the past 3 months.    Clinical Impression   PTA pt lived at home with his brother and sister, who provided assistance with ADLs. Pt was requiring total A for lifts and for bathing/dressing. Pt is able to use RUE on joystick for power chair and would like to maintain ROM and functional use of RUE. Pt will benefit from ST Rehab at SNF, unless family is able to continue providing total A. If going home, pt will benefit from hoyer lift, hospital bed, and HHOT. Pt will benefit from acute OT to maintain functional use of RUE through ROM and positioning.    Follow Up Recommendations  SNF;Supervision/Assistance - 24 hour (unless family is comfortable with continuing care)    Equipment Recommendations  Other (comment);Hospital bed (hoyer lift)    Recommendations for Other Services       Precautions / Restrictions Precautions Precautions: Fall Precaution Comments: pt slid OOB just prior to admission, pt/family report no other falls in past year Restrictions Weight Bearing Restrictions: No      Mobility Bed Mobility Overal bed mobility: +2 for physical assistance;Needs Assistance Bed Mobility: Rolling Rolling: +2 for physical assistance;Total assist         General bed mobility comments: Total A for all mobility.   Transfers                 General transfer comment: NT, pt requiring total A at baseline         ADL Overall ADL's : Needs assistance/impaired Eating/Feeding: Modified independent;Bed level Eating/Feeding Details (indicate cue type and reason): with use of LUE Grooming: Set up;Bed  level Grooming Details (indicate cue type and reason): with use of LUE Upper Body Bathing: Maximal assistance;Bed level   Lower Body Bathing: Total assistance;Bed level   Upper Body Dressing : Maximal assistance;Bed level   Lower Body Dressing: Total assistance;Bed level   Toilet Transfer: Total assistance       Tub/ Shower Transfer: Total assistance     General ADL Comments: Discussed ADLs at home and pt reports his brother helps him with nearly all ADLs however pt has movement in LUE and able to use for self-feeding and grooming tasks. Pt prefers to continue to use RUE for steering his power wheelchair. Educated pt on stretches for his RUE to tolerance (mainly thumb and 2nd digit) to maintain function and ROM.      Vision Additional Comments: No change from baseline.           Pertinent Vitals/Pain Pain Assessment: Faces Faces Pain Scale: Hurts little more Pain Location: R hand with PROM Pain Descriptors / Indicators: Tightness;Grimacing Pain Intervention(s): Monitored during session;Limited activity within patient's tolerance     Hand Dominance Right (has more use of left hand)   Extremity/Trunk Assessment Upper Extremity Assessment Upper Extremity Assessment: RUE deficits/detail RUE Deficits / Details: 4th and 5th digits contracted fully into flexion. 3rd digit nearly full contraction with minimal movement and pt c/o pain after about 1/4 extension. 2nd finger mildly contracted but able to passively stretch to nearly full extension. Pt thumb able to flex, but CMC is stiff and most immobile. Pt reports that he  uses his R hand with thumb and 2nd finger to control the joystick on his power chair.    Lower Extremity Assessment Lower Extremity Assessment: Defer to PT evaluation       Communication Communication Communication: No difficulties   Cognition Arousal/Alertness: Awake/alert Behavior During Therapy: Flat affect Overall Cognitive Status: Within Functional  Limits for tasks assessed                        Exercises Exercises: Other exercises Other Exercises Other Exercises: Demonstrated gentle PROM of R 1st and 2nd digits to maintain ROM and function with pt return demo.         Home Living Family/patient expects to be discharged to:: Private residence Living Arrangements: Other (Comment) (pt's brother and sister) Available Help at Discharge: Available 24 hours/day;Family Type of Home: House Home Access: Ramped entrance     Home Layout: Two level;Able to live on main level with bedroom/bathroom     Bathroom Shower/Tub: Producer, television/film/video: Standard     Home Equipment: Bedside commode;Wheelchair - power;Walker - 2 wheels;Shower seat          Prior Functioning/Environment Level of Independence: Needs assistance  Gait / Transfers Assistance Needed: total assist to transfer to [facility] for past 3 months, prior to that pt was able to independently transfer, steers power WC independently with Right hand ADL's / Homemaking Assistance Needed: Pt reports that his brother dresses him. Brother total lifts pt into shower and pt sits on shower chair and has assistance for his shower. Pt is able to self-feed using his left (non-dominant) hand. He uses his right hand to control his power chair.         OT Diagnosis: Generalized weakness;Other (comment) (contracture of RUE)   OT Problem List: Decreased strength;Decreased range of motion;Decreased activity tolerance;Impaired balance (sitting and/or standing);Impaired UE functional use   OT Treatment/Interventions: Self-care/ADL training;Therapeutic exercise;Neuromuscular education;Energy conservation;DME and/or AE instruction;Therapeutic activities;Patient/family education    OT Goals(Current goals can be found in the care plan section) Acute Rehab OT Goals Patient Stated Goal: to keep using my hand on my w/c OT Goal Formulation: With patient Time For Goal Achievement:  06/18/14 Potential to Achieve Goals: Good ADL Goals Pt/caregiver will Perform Home Exercise Program: Increased ROM;Right Upper extremity;Independently  OT Frequency: Min 2X/week    End of Session  Activity Tolerance: Patient tolerated treatment well Patient left: in bed;with call bell/phone within reach;with bed alarm set   Time: 6578-4696 OT Time Calculation (min): 17 min Charges:  OT General Charges $OT Visit: 1 Procedure OT Evaluation $Initial OT Evaluation Tier I: 1 Procedure G-Codes:    Rae Lips 06/13/2014, 5:20 PM  Carney Living, OTR/L Occupational Therapist [phone] (pager)

## 2014-06-04 NOTE — Progress Notes (Signed)
Subjective:no further complaints.   Exam: Filed Vitals:   06/04/14 0559  BP: 120/79  Pulse: 105  Temp: 100.3 F (37.9 C)  Resp: 22    HEENT-  Normocephalic, no lesions, without obvious abnormality.  Normal external eye and conjunctiva.  Normal TM's bilaterally.  Normal auditory canals and external ears. Normal external nose, mucus membranes and septum.  Normal pharynx. Cardiovascular- S1, S2 normal, pulses palpable throughout   Lungs- chest clear, no wheezing, rales, normal symmetric air entry Abdomen- soft, non-tender; bowel sounds normal; no masses,  no organomegaly Extremities- no edema Lymph-no adenopathy palpable Musculoskeletal-no joint tenderness, deformity or swelling Skin-warm and dry, no hyperpigmentation, vitiligo, or suspicious lesions    Gen: In bed, NAD MS: Alert and oriented,  Normal speech. Follows all commands.  RU:EAVWUJ equal, briskly reactive to light. On right gaze left eye doesn't past midline and right eye abducts with no nystagmus. On left gaze right doesn't past midline and left abducts without nystagmus. Up and down gaze is normal Visual fields full to confrontation. Hearing intact and symmetric to finger snap. Facial sensation intact. R facial weakness with activation. Neck flexion and extension normal.  Motor: Flexed posture of RUE with limited movement, 4-4+/5 strength LUE though question effort. Unable to raise bilateral LE off bed, distal 4 to 4+/5 though again question effort Sensory: intact    Pertinent Labs: none  Felicie Morn PA-C [facility] [phone]  Impression: 53y/o gentleman with history of RRMS, poorly compliant with tecfidera, presenting with fall and progressive decline over the past few months. MRI brain shows no acute lesion. Suspect this likely represents a pseudo MS exacerbation likely related to underlying infectious process (WBC 25, UA +).    Recommendations: 1) Continue to treat underlying infections 2) PT 3)  Will need to follow up out patient with neurology for further recommendations and compliance with tecfidera.  Neurology will S/O    06/04/2014, 10:59 AM

## 2014-06-04 NOTE — Progress Notes (Signed)
UR completed 

## 2014-06-04 NOTE — Progress Notes (Signed)
MD Craige Cotta paged, patient hasn't had BM since 3/27 per patient, asked for intervention.

## 2014-06-04 NOTE — Evaluation (Signed)
Clinical/Bedside Swallow Evaluation Patient Details  Name: Mario Shields MRN: [id number] Date of Birth: [birth year]  Today's Date: 06/04/2014 Time: SLP Start Time (ACUTE ONLY): 0910 SLP Stop Time (ACUTE ONLY): 0930 SLP Time Calculation (min) (ACUTE ONLY): 20 min  Past Medical History:  Past Medical History  Diagnosis Date  . MS (multiple sclerosis)   . Hypertension   . Hyperlipidemia   . Anemia   . Type II diabetes mellitus   . GERD (gastroesophageal reflux disease)   . Depression    Past Surgical History:  Past Surgical History  Procedure Laterality Date  . Appendectomy     HPI:  54 yo man with a PMH significant for multiple sclerosis (initial dx 1990), hypertension, hyperlipidemia, and diabetes mellitus who presents for further evaluation of slurred speech and a fall.  Family states on 06-02-14  he slid out of bed, and his brother found him when he got home from work. MS has had progressive decline since February. Currently unable to ambulate and requires assistance. Family has also noted decreased PO intake and difficulty swallowing over the past few months. MRI on 06-03-14 negative for acute intracranial abnormality, with progressed chronic demyelinating disease and interrnal small vessel ischemia   Assessment / Plan / Recommendation Clinical Impression  Pt presents with mild to moderate oral pharyngeal dysphagia characterized decreased timely swallow, suspected delayed swallow, and decreased coordination during AP transfer of all consistencies trialed. These include thin, nectar thick, puree, and regular consistencies. Pt with poor insight to deficits despite coughing after thin and nectar thick liquids. Continue NPO diet at this time with MBS scheduled this date to further objectively assess swallow function.    Aspiration Risk  Moderate    Diet Recommendation NPO   Medication Administration: Via alternative means    Other  Recommendations Recommended Consults: MBS Oral Care  Recommendations:  (QID)   Follow Up Recommendations       Frequency and Duration min 2x/week  2 weeks       SLP Swallow Goals     Swallow Study Prior Functional Status  Type of Home: House Available Help at Discharge: Available 24 hours/day;Family    General Date of Onset: 06/03/14 HPI: 54 yo man with a PMH significant for multiple sclerosis (initial dx 1990), hypertension, hyperlipidemia, and diabetes mellitus who presents for further evaluation of slurred speech and a fall.  Family states on 06-02-14  he slid out of bed, and his brother found him when he got home from work. MS has had progressive decline since February. Currently unable to ambulate and requires assistance. Family has also noted decreased PO intake and difficulty swallowing over the past few months. MRI on 06-03-14 negative for acute intracranial abnormality, with progressed chronic demyelinating disease and interrnal small vessel ischemia Type of Study: Bedside swallow evaluation Diet Prior to this Study: NPO Temperature Spikes Noted: Yes Respiratory Status: Room air Behavior/Cognition: Alert Oral Cavity - Dentition: Adequate natural dentition Self-Feeding Abilities: Needs set up;Able to feed self Patient Positioning: Upright in bed Baseline Vocal Quality: Low vocal intensity (mild dysarthria) Volitional Cough: Strong Volitional Swallow: Able to elicit    Oral/Motor/Sensory Function Overall Oral Motor/Sensory Function: Impaired Labial ROM: Reduced right Labial Symmetry: Abnormal symmetry right Labial Strength: Reduced Labial Sensation: Within Functional Limits Lingual ROM: Reduced right;Reduced left Facial Symmetry: Right droop   Ice Chips Ice chips: Impaired Presentation: Spoon Oral Phase Impairments: Reduced lingual movement/coordination;Impaired anterior to posterior transit Oral Phase Functional Implications: Prolonged oral transit Pharyngeal Phase Impairments: Suspected delayed  Swallow   Thin Liquid  Thin Liquid: Impaired Presentation: Cup;Straw;Spoon Oral Phase Impairments: Impaired anterior to posterior transit;Reduced lingual movement/coordination Oral Phase Functional Implications: Prolonged oral transit Pharyngeal  Phase Impairments: Suspected delayed Swallow;Cough - Delayed Other Comments:  (poor insight to deficits)    Nectar Thick Nectar Thick Liquid: Impaired Presentation: Cup Oral Phase Impairments: Reduced lingual movement/coordination;Impaired anterior to posterior transit Oral phase functional implications: Prolonged oral transit Pharyngeal Phase Impairments: Suspected delayed Swallow;Cough - Immediate   Honey Thick Honey Thick Liquid: Not tested   Puree Puree: Impaired Presentation: Spoon Oral Phase Impairments: Impaired anterior to posterior transit;Reduced lingual movement/coordination   Solid   GO Functional Limitations: Swallowing  Solid: Impaired Presentation: Self Fed Oral Phase Impairments: Impaired anterior to posterior transit;Reduced lingual movement/coordination      Mario Duos MA, CCC-SLP Acute Care Speech Language Pathologist    Mario Shields 06/04/2014,10:04 AM

## 2014-06-04 NOTE — Progress Notes (Signed)
VIS date not located on vaccine.

## 2014-06-04 NOTE — Progress Notes (Signed)
Patient vaccine moved to 1200, pt was taken down for swallow study before medication was received from pharmacy.

## 2014-06-04 NOTE — Progress Notes (Addendum)
Paged per sticky note  Weekday Hours (7AM-5PM):  1st Contact:  [name], MSIV Pager: [phone]    2nd Contact: Dr. Delane Ginger Pager: [phone]  For MD to order PRAFOs to prevent foot contractures r/t patient foot drop per PT see PT note, currently awaiting order.    Patient home supply of Dimethyl Fumarate CPDR 240 mg to be dropped off today by family per pharmacy via phone call.

## 2014-06-04 NOTE — Progress Notes (Signed)
Patient brother Drake Leach says he will be able to bring patient home medication in tomorrow, he has not brought it today, [phone] (direct cell number).

## 2014-06-04 NOTE — Progress Notes (Addendum)
All PO Meds to be held per MD, patient has failed swallow screen, failed speech therapy bedside swallow evaluation this morning, scheduled for modified barium swallow this afternoon per Speech Therapy [place].

## 2014-06-04 NOTE — Progress Notes (Signed)
Subjective:   Pt lying in bed requesting water.  VSS.    Objective:   Vital signs in last 24 hours: Filed Vitals:   06/03/14 1800 06/03/14 1857 06/03/14 2152 06/04/14 0559  BP: 109/77 126/83 119/72 120/79  Pulse: 107 98 109 105  Temp:  100.3 F (37.9 C) 100.2 F (37.9 C) 100.3 F (37.9 C)  TempSrc:  Oral Oral Oral  Resp: Height:      Weight:      SpO2: 97% 99% 96% 94%    Weight: Filed Weights   06/03/14 1018  Weight: 75.751 kg (167 lb)    I/Os:  Intake/Output Summary (Last 24 hours) at 06/04/14 1055 Last data filed at 06/04/14 0945  Gross per 24 hour  Intake      0 ml  Output    400 ml  Net   -400 ml    Physical Exam: Constitutional: Vital signs reviewed.  Patient is lying in bed in no acute distress and cooperative with exam.   HEENT: [place]/AT Cardiovascular: tachycardic, regular rhythm,  no MRG Pulmonary/Chest: normal respiratory effort, no accessory muscle use, CTAB, no wheezes, rales, or rhonchi Abdominal: Soft. +BS, NT/ND Neurological: A&O x2, CN II-XII grossly intact; non-focal exam Extremities: 2+DP b/l, no C/C/E  Skin: Warm, dry and intact.   Lab Results:  BMP:  Recent Labs Lab 06/03/14 1050 06/03/14 1631  NA 141 140  K 3.7 3.8  CL 99 100  CO2  --  28  GLUCOSE 133* 97  BUN 8 6  CREATININE 0.80 0.78  CALCIUM  --  10.5    CBC:  Recent Labs Lab 06/03/14 1631 06/04/14 0542  WBC 25.0* 26.3*  NEUTROABS 18.4*  --   HGB 15.9 14.5  HCT 44.5 41.8  MCV 84.9 86.9  PLT 214 205    Coagulation: No results for input(s): LABPROT, INR in the last 168 hours.  CBG:            Recent Labs Lab 06/03/14 1524 06/03/14 1854 06/03/14 2147 06/04/14 0016 06/04/14 0422 06/04/14 0755  GLUCAP 101* 118* 124* 155* 121* 98           HA1C:      No results for input(s): HGBA1C in the last 168 hours.  Lipid Panel: No results for input(s): CHOL, HDL, LDLCALC, TRIG, CHOLHDL, LDLDIRECT in the last 168 hours.  LFTs:  Recent  Labs Lab 06/03/14 1631  AST 22  ALT 37  ALKPHOS 116  BILITOT 1.0  PROT 7.8  ALBUMIN 4.4    Pancreatic Enzymes: No results for input(s): LIPASE, AMYLASE in the last 168 hours.  Lactic Acid/Procalcitonin: No results for input(s): LATICACIDVEN, PROCALCITON in the last 168 hours.  Ammonia: No results for input(s): AMMONIA in the last 168 hours.  Cardiac Enzymes:  Recent Labs Lab 06/03/14 2330  TROPONINI <0.03    EKG: EKG Interpretation  Date/Time:  Wednesday June 03 2014 10:14:36 EDT Ventricular Rate:  114 PR Interval:  183 QRS Duration: 90 QT Interval:  332 QTC Calculation: 457 R Axis:   4 Text Interpretation:  Sinus tachycardia Probable left atrial enlargement Inferior infarct, old SINCE LAST TRACING HEART RATE HAS INCREASED Confirmed by Ethelda Chick  MD, SAM [phone]) on 06/03/2014 4:36:31 PM   BNP: No results for input(s): PROBNP in the last 168 hours.  D-Dimer: No results for input(s): DDIMER in the last 168 hours.  Urinalysis:  Recent Labs Lab 06/03/14 1054  COLORURINE YELLOW  LABSPEC 1.010  PHURINE 6.0  GLUCOSEU NEGATIVE  HGBUR SMALL*  BILIRUBINUR NEGATIVE  KETONESUR NEGATIVE  PROTEINUR NEGATIVE  UROBILINOGEN 1.0  NITRITE POSITIVE*  LEUKOCYTESUR TRACE*    Micro Results: No results found for this or any previous visit (from the past 240 hour(s)).  Blood Culture: No results found for: SDES, SPECREQUEST, CULT, REPTSTATUS  Studies/Results: Mr Lodema Pilot Contrast  06/03/2014   CLINICAL DATA:  54 year old male with multiple sclerosis. Found down, fall from wheelchair. Right side weakness and slurred speech. Initial encounter.  EXAM: MRI HEAD WITHOUT AND WITH CONTRAST  TECHNIQUE: Multiplanar, multiecho pulse sequences of the brain and surrounding structures were obtained without and with intravenous contrast.  CONTRAST:  15 mL MultiHance.  COMPARISON:  Brain MRI 04/15/2012 and earlier.  FINDINGS: Mild generalized cerebral volume loss since 2014.  New  since 2014 is moderate to severe T2 signal abnormality involving the left caudate nucleus and right paracentral pons. These more resembles sequelae of small vessel ischemia then demyelinating disease, and there may be associated hemosiderin deposition at both sites.  However, there has been significant progression of abnormal T2 and FLAIR hyperintensity within the bilateral cerebellar peduncle is a which is typical of progressed demyelinating disease. There is also progressed indistinct T2 hyperintensity in the left paracentral pons. There is then significantly progressed bilateral cerebellar hemispheric T2 hyperintensity which could relate to either chronic lacunar infarcts or demyelinating disease. This is more severe on the left (series 4, image 7).  Advanced chronic supratentorial white matter T2 and FLAIR hyperintensity then has not significantly changed since 2014.  No restricted diffusion or evidence of acute infarction. Major intracranial vascular flow voids are stable. No abnormal enhancement identified.  No midline shift, mass effect, evidence of mass lesion, ventriculomegaly, extra-axial collection or acute intracranial hemorrhage. Cervicomedullary junction and pituitary are within normal limits. 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 Grossly stable and negative visualized cervical spinal cord. Normal bone marrow signal.  Visible internal auditory structures appear normal. Mastoids are clear. Trace paranasal sinus mucosal thickening. Bilateral orbits soft tissues appear stable. Visualized scalp soft tissues are within normal limits.  IMPRESSION: 1. Progressed signal abnormality in the brain since 2014 which suggest progression of both chronic demyelinating disease, and interval small vessel ischemia. 2. No acute intracranial abnormality or active demyelinating disease identified.   Electronically Signed   By: Odessa Fleming M.D.   On: 06/03/2014 15:06    Medications:  Scheduled Meds: .  antiseptic oral rinse  7 mL Mouth Rinse q12n4p  . aspirin EC  81 mg Oral Daily  . atorvastatin  40 mg Oral QHS  . buPROPion  150 mg Oral BID  . cefTRIAXone (ROCEPHIN)  IV  1 g Intravenous Q24H  . chlorhexidine  15 mL Mouth Rinse BID  . cholecalciferol  1,000 Units Oral Daily  . Dimethyl Fumarate  1 capsule Oral BID  . heparin  5,000 Units Subcutaneous 3 times per day  . multivitamin with minerals  1 tablet Oral Daily  . pantoprazole  40 mg Oral Q0600  . pneumococcal 23 valent vaccine  0.5 mL Intramuscular Tomorrow-1000   Continuous Infusions: . sodium chloride     PRN Meds:   Antibiotics: Antibiotics Given (last 72 hours)    Date/Time Action Medication Dose Rate   06/03/14 2340 Given   cefTRIAXone (ROCEPHIN) 1 g in dextrose 5 % 50 mL IVPB - Premix 1 g 100 mL/hr      Day of Hospitalization:   Consults: Treatment Team:  Neuro1  Doristine Johns, MD  Assessment/Plan:   Principal Problem:   Sepsis due to urinary tract infection Active Problems:   Diabetes mellitus   Essential hypertension, benign   Dyslipidemia   Multiple sclerosis, relapsing-remitting   Sepsis likely due to UTI UA suggests UTI and likely sources of generalized weakness and leukocytosis. Lungs are CTAB so PNA unlikely source of sepsis and will defer CXR for now.  -cont ceftriaxone -cont  -Blood and urine cx pending  -d/c all anticholinergic meds that may be contributing   RRMS Pt has not seen a neurologist in a while and has been followed by the [facility] physician. Has not been on prednisone since 2014. He is on tecfidera for MS which he recently restarted. But his sister states this causes slurred speech. He is also on enablex for bladder spasms at home but being held here d/t anticholinergic effects.  Neurology consulted recommends tx underlying infection likely pseudo flair.   -PT/OT -cont tecfidera when able  -appreciate neuro recs   Dysphagia Pt failed initial RN bedside swallow study in the ED.   SLP recommending NPO until MBS.   -MBS today -cont NPO  -cont IVF   Hypertension  Stable.  -cont hold meds    Diabetes Mellitus II  Pt on metformin 1000mg /day and lantus for home meds.  -d/c metformin and lantus  -SSI-S, ac and hs cbg  GERD  Pt on chronic PPI at home. -continue home meds  Dyslipidemia  Pt is on lipitor 40mg  qhs at home.  -continue statin therapy  Substance abuse  Pt has a h/o tobacco use -smoking cessation   Fall  Unclear if syncopal episode. Has h/o prior falls. No known h/o seizures. No cardiac history. Denies dizziness, lightheadedness, palpitations, diaphoresis, N/V. Unfortunately orthostatics were not checked prior to IVF.  EKG without acute changes, trop neg, UDS neg.   -PT/OT   FEN Fluids-NS  Electrolytes-replete PRN  Nutrition- NPO awaiting MBS     Disposition Disposition is deferred, awaiting improvement of current medical problems.  Anticipated discharge in approximately 1-2 day(s).     Marrian Salvage, MD PGY-2, Internal Medicine Teaching Service 06/04/2014, 10:55 AM

## 2014-06-04 NOTE — Progress Notes (Signed)
Orthopedic Tech Progress Note Patient Details:  Mario Shields 04-20-1960 [id number]  Ortho Devices Type of Ortho Device: Postop shoe/boot Ortho Device/Splint Location: (B) prafo boots Ortho Device/Splint Interventions: Ordered, Application   Jennye Moccasin 06/04/2014, 2:53 PM

## 2014-06-05 ENCOUNTER — Inpatient Hospital Stay (HOSPITAL_COMMUNITY): Payer: Medicare (Managed Care)

## 2014-06-05 DIAGNOSIS — N390 Urinary tract infection, site not specified: Secondary | ICD-10-CM

## 2014-06-05 DIAGNOSIS — A419 Sepsis, unspecified organism: Principal | ICD-10-CM

## 2014-06-05 LAB — CBC WITH DIFFERENTIAL/PLATELET
BASOS PCT: 0 % (ref 0–1)
Basophils Absolute: 0 10*3/uL (ref 0.0–0.1)
EOS PCT: 0 % (ref 0–5)
Eosinophils Absolute: 0 10*3/uL (ref 0.0–0.7)
HCT: 38.7 % — ABNORMAL LOW (ref 39.0–52.0)
Hemoglobin: 13.4 g/dL (ref 13.0–17.0)
LYMPHS PCT: 15 % (ref 12–46)
Lymphs Abs: 3.4 10*3/uL (ref 0.7–4.0)
MCH: 29.9 pg (ref 26.0–34.0)
MCHC: 34.6 g/dL (ref 30.0–36.0)
MCV: 86.4 fL (ref 78.0–100.0)
Monocytes Absolute: 3.1 10*3/uL — ABNORMAL HIGH (ref 0.1–1.0)
Monocytes Relative: 13 % — ABNORMAL HIGH (ref 3–12)
NEUTROS ABS: 16.9 10*3/uL — AB (ref 1.7–7.7)
Neutrophils Relative %: 72 % (ref 43–77)
PLATELETS: 181 10*3/uL (ref 150–400)
RBC: 4.48 MIL/uL (ref 4.22–5.81)
RDW: 13.2 % (ref 11.5–15.5)
WBC: 23.4 10*3/uL — ABNORMAL HIGH (ref 4.0–10.5)

## 2014-06-05 LAB — GLUCOSE, CAPILLARY
GLUCOSE-CAPILLARY: 123 mg/dL — AB (ref 70–99)
GLUCOSE-CAPILLARY: 132 mg/dL — AB (ref 70–99)
GLUCOSE-CAPILLARY: 168 mg/dL — AB (ref 70–99)
Glucose-Capillary: 148 mg/dL — ABNORMAL HIGH (ref 70–99)

## 2014-06-05 LAB — PROCALCITONIN: PROCALCITONIN: 1.67 ng/mL

## 2014-06-05 LAB — LACTIC ACID, PLASMA: Lactic Acid, Venous: 0.9 mmol/L (ref 0.5–2.0)

## 2014-06-05 MED ORDER — PIPERACILLIN-TAZOBACTAM 3.375 G IVPB
3.3750 g | Freq: Three times a day (TID) | INTRAVENOUS | Status: DC
  Administered 2014-06-05 (×2): 3.375 g via INTRAVENOUS
  Filled 2014-06-05 (×4): qty 50

## 2014-06-05 MED ORDER — CEFTRIAXONE SODIUM IN DEXTROSE 20 MG/ML IV SOLN
1.0000 g | INTRAVENOUS | Status: DC
  Administered 2014-06-05: 1 g via INTRAVENOUS
  Filled 2014-06-05 (×2): qty 50

## 2014-06-05 MED ORDER — VANCOMYCIN HCL IN DEXTROSE 750-5 MG/150ML-% IV SOLN
750.0000 mg | Freq: Three times a day (TID) | INTRAVENOUS | Status: DC
  Administered 2014-06-05: 750 mg via INTRAVENOUS
  Filled 2014-06-05 (×3): qty 150

## 2014-06-05 MED ORDER — SODIUM CHLORIDE 0.9 % IV SOLN
INTRAVENOUS | Status: DC
  Administered 2014-06-05 – 2014-06-06 (×2): via INTRAVENOUS

## 2014-06-05 MED ORDER — PIPERACILLIN-TAZOBACTAM 3.375 G IVPB
3.3750 g | INTRAVENOUS | Status: AC
  Administered 2014-06-05: 3.375 g via INTRAVENOUS
  Filled 2014-06-05: qty 50

## 2014-06-05 MED ORDER — VANCOMYCIN HCL IN DEXTROSE 1-5 GM/200ML-% IV SOLN
1000.0000 mg | INTRAVENOUS | Status: AC
Start: 2014-06-05 — End: 2014-06-05
  Administered 2014-06-05: 1000 mg via INTRAVENOUS
  Filled 2014-06-05: qty 200

## 2014-06-05 NOTE — Progress Notes (Addendum)
ANTIBIOTIC CONSULT NOTE - INITIAL  Pharmacy Consult for Vancomycin and Zosyn Indication:  Sepsis  No Known Allergies  Patient Measurements: Height: 5\' 8"  (172.7 cm) Weight: 167 lb (75.751 kg) IBW/kg (Calculated) : 68.4   Vital Signs: Temp: 103 F (39.4 C) (04/07 2228) Temp Source: Oral (04/07 2228) BP: 158/88 mmHg (04/07 2228) Pulse Rate: 109 (04/07 2228) Intake/Output from previous day: 04/07 0701 - 04/08 0700 In: 0  Out: 350 [Urine:350] Intake/Output from this shift:    Labs:  Recent Labs  06/03/14 1050 06/03/14 1631 06/04/14 0542  WBC  --  25.0* 26.3*  HGB 17.7* 15.9 14.5  PLT  --  214 205  CREATININE 0.80 0.78  --    Estimated Creatinine Clearance: 100.9 mL/min (by C-G formula based on Cr of 0.78). No results for input(s): VANCOTROUGH, VANCOPEAK, VANCORANDOM, GENTTROUGH, GENTPEAK, GENTRANDOM, TOBRATROUGH, TOBRAPEAK, TOBRARND, AMIKACINPEAK, AMIKACINTROU, AMIKACIN in the last 72 hours.   Microbiology: No results found for this or any previous visit (from the past 720 hour(s)).  Medical History: Past Medical History  Diagnosis Date  . MS (multiple sclerosis)   . Hypertension   . Hyperlipidemia   . Anemia   . Type II diabetes mellitus   . GERD (gastroesophageal reflux disease)   . Depression     Medications:  Scheduled:  . antiseptic oral rinse  7 mL Mouth Rinse q12n4p  . aspirin EC  81 mg Oral Daily  . atorvastatin  40 mg Oral QHS  . buPROPion  150 mg Oral BID  . chlorhexidine  15 mL Mouth Rinse BID  . cholecalciferol  1,000 Units Oral Daily  . Dimethyl Fumarate  1 capsule Oral BID  . heparin  5,000 Units Subcutaneous 3 times per day  . insulin aspart  0-9 Units Subcutaneous TID WC  . multivitamin with minerals  1 tablet Oral Daily  . pantoprazole  40 mg Oral Q0600  . piperacillin-tazobactam (ZOSYN)  IV  3.375 g Intravenous STAT  . vancomycin  1,000 mg Intravenous STAT   Assessment: 54 y.o male with sepsis likely due to UTI. Received  ceftriaxone 1g IV on 4/7 at 20:00. Pharmacy consulted to start IV vancomycin and Zosyn. Blood and urine cx's pending  SCR 0.78, estimated CrCl ~ 100 ml/min  Goal of Therapy:  Vancomycin trough level 15-20 mcg/ml  Plan:  Vancomycin 1g STAT then 750 IV q8h Zosyn 3.375 g STAT then 3.375 g IV q8h (4hr infusion) Monitor clinical status, renal function, culture results daily.   Noah Delaine, RPh Clinical Pharmacist Pager: [phone] 06/05/2014,12:45 AM  ADDN: Pharmacy is consulted to transition vanc/zosyn to ceftriaxone for UTI. Pt remains low-grade febrile to 99.4, WBC 23.4.  Plan: Stop vanc/zosyn Ceftriaxone 1g IV q24h  Pharmacy will sign off.  Arlean Hopping. Newman Pies, PharmD Clinical Pharmacist Pager [phone]

## 2014-06-05 NOTE — Progress Notes (Signed)
Subjective:   Apparently pt spiked fever overnight of 103?  and was started on vanc/zosyn.  Given APAP.  Leukocytosis improving.   Objective:   Vital signs in last 24 hours: Filed Vitals:   06/05/14 0101 06/05/14 0549 06/05/14 0746 06/05/14 1506  BP:  117/77  125/72  Pulse:  91  86  Temp: 99.2 F (37.3 C) 100.9 F (38.3 C) 99.4 F (37.4 C) 100.7 F (38.2 C)  TempSrc: Oral Oral Oral Oral  Resp:  16  16  Height:      Weight:      SpO2:  98%  98%    Weight: Filed Weights   06/03/14 1018  Weight: 75.751 kg (167 lb)    I/Os:  Intake/Output Summary (Last 24 hours) at 06/05/14 1627 Last data filed at 06/05/14 0934  Gross per 24 hour  Intake   1490 ml  Output    600 ml  Net    890 ml    Physical Exam: Constitutional: Vital signs reviewed.  Patient is lying in bed in no acute distress and cooperative with exam.   HEENT: [place]/AT Cardiovascular: tachycardic, regular rhythm,  no MRG Pulmonary/Chest: normal respiratory effort, no accessory muscle use, CTAB, no wheezes, rales, or rhonchi Abdominal: Soft. +BS, NT/ND Neurological: A&O x2, CN II-XII grossly intact; non-focal exam Extremities: 2+DP b/l, no C/C/E  Skin: Warm, dry and intact  Lab Results:  BMP:  Recent Labs Lab 06/03/14 1050 06/03/14 1631  NA 141 140  K 3.7 3.8  CL 99 100  CO2  --  28  GLUCOSE 133* 97  BUN 8 6  CREATININE 0.80 0.78  CALCIUM  --  10.5    CBC:  Recent Labs Lab 06/03/14 1631 06/04/14 0542 06/05/14 0447  WBC 25.0* 26.3* 23.4*  NEUTROABS 18.4*  --  16.9*  HGB 15.9 14.5 13.4  HCT 44.5 41.8 38.7*  MCV 84.9 86.9 86.4  PLT 214 205 181    Coagulation: No results for input(s): LABPROT, INR in the last 168 hours.  CBG:            Recent Labs Lab 06/04/14 0755 06/04/14 1212 06/04/14 1703 06/04/14 2222 06/05/14 0810 06/05/14 1233  GLUCAP 98 118* 155* 126* 123* 132*           HA1C:      No results for input(s): HGBA1C in the last 168 hours.  Lipid Panel: No  results for input(s): CHOL, HDL, LDLCALC, TRIG, CHOLHDL, LDLDIRECT in the last 168 hours.  LFTs:  Recent Labs Lab 06/03/14 1631  AST 22  ALT 37  ALKPHOS 116  BILITOT 1.0  PROT 7.8  ALBUMIN 4.4    Pancreatic Enzymes: No results for input(s): LIPASE, AMYLASE in the last 168 hours.  Lactic Acid/Procalcitonin:  Recent Labs Lab 06/05/14 0032  LATICACIDVEN 0.9  PROCALCITON 1.67    Ammonia: No results for input(s): AMMONIA in the last 168 hours.  Cardiac Enzymes:  Recent Labs Lab 06/03/14 2330  TROPONINI <0.03    EKG: EKG Interpretation  Date/Time:  Wednesday June 03 2014 10:14:36 EDT Ventricular Rate:  114 PR Interval:  183 QRS Duration: 90 QT Interval:  332 QTC Calculation: 457 R Axis:   4 Text Interpretation:  Sinus tachycardia Probable left atrial enlargement Inferior infarct, old SINCE LAST TRACING HEART RATE HAS INCREASED Confirmed by Ethelda Chick  MD, SAM [phone]) on 06/03/2014 4:36:31 PM   BNP: No results for input(s): PROBNP in the last 168 hours.  D-Dimer: No results for input(s):  DDIMER in the last 168 hours.  Urinalysis:  Recent Labs Lab 06/03/14 1054  COLORURINE YELLOW  LABSPEC 1.010  PHURINE 6.0  GLUCOSEU NEGATIVE  HGBUR SMALL*  BILIRUBINUR NEGATIVE  KETONESUR NEGATIVE  PROTEINUR NEGATIVE  UROBILINOGEN 1.0  NITRITE POSITIVE*  LEUKOCYTESUR TRACE*    Micro Results: Recent Results (from the past 240 hour(s))  Urine culture     Status: None (Preliminary result)   Collection Time: 06/03/14 10:54 AM  Result Value Ref Range Status   Specimen Description URINE, RANDOM  Final   Special Requests NONE  Final   Colony Count   Final    >=100,000 COLONIES/ML Performed at [facility]    Culture   Final    ESCHERICHIA COLI Performed at [facility]    Report Status PENDING  Incomplete  Culture, blood (routine x 2)     Status: None (Preliminary result)   Collection Time: 06/03/14  8:45 PM  Result Value Ref Range Status    Specimen Description BLOOD LEFT HAND  Final   Special Requests   Final    BOTTLES DRAWN AEROBIC AND ANAEROBIC BLUE RED   Culture   Final           BLOOD CULTURE RECEIVED NO GROWTH TO DATE CULTURE WILL BE HELD FOR 5 DAYS BEFORE ISSUING A FINAL NEGATIVE REPORT Performed at [facility]    Report Status PENDING  Incomplete  Culture, blood (routine x 2)     Status: None (Preliminary result)   Collection Time: 06/03/14  8:50 PM  Result Value Ref Range Status   Specimen Description BLOOD RIGHT ARM  Final   Special Requests BOTTLES DRAWN AEROBIC ONLY 5CC  Final   Culture   Final           BLOOD CULTURE RECEIVED NO GROWTH TO DATE CULTURE WILL BE HELD FOR 5 DAYS BEFORE ISSUING A FINAL NEGATIVE REPORT Performed at [facility]    Report Status PENDING  Incomplete    Blood Culture:    Component Value Date/Time   SDES BLOOD RIGHT ARM 06/03/2014 2050   SPECREQUEST BOTTLES DRAWN AEROBIC ONLY 5CC 06/03/2014 2050   CULT  06/03/2014 2050           BLOOD CULTURE RECEIVED NO GROWTH TO DATE CULTURE WILL BE HELD FOR 5 DAYS BEFORE ISSUING A FINAL NEGATIVE REPORT Performed at [facility]    REPTSTATUS PENDING 06/03/2014 2050    Studies/Results: Dg Chest 2 View  06/05/2014   CLINICAL DATA:  Fever. Multiple sclerosis. New slurred speech. Altered mental status. Sepsis. Found to have urinary tract infection but still spiking fevers on treatment. Concern for pneumonia.  EXAM: CHEST  2 VIEW  COMPARISON:  04/15/2012  FINDINGS: The heart size and mediastinal contours are within normal limits. Both lungs are clear. The visualized skeletal structures are unremarkable.  IMPRESSION: No active cardiopulmonary disease.   Electronically Signed   By: Burman Nieves M.D.   On: 06/05/2014 00:44   Dg Swallowing Func-speech Pathology  06/04/2014    Objective Swallowing Evaluation:   Modified Barium Swallow  Patient Details  Name: Mario Shields MRN: [id number] Date of Birth: [birth year]   Today's Date: 06/04/2014 Time: SLP Start Time (ACUTE ONLY): 1113-SLP Stop Time (ACUTE ONLY): 1137 SLP Time Calculation (min) (ACUTE ONLY): 24 min  Past Medical History:  Past Medical History  Diagnosis Date  . MS (multiple sclerosis)   . Hypertension   . Hyperlipidemia   . Anemia   .  Type II diabetes mellitus   . GERD (gastroesophageal reflux disease)   . Depression    Past Surgical History:  Past Surgical History  Procedure Laterality Date  . Appendectomy     HPI:  HPI: 54 yo man with a PMH significant for multiple sclerosis (initial dx  1990), hypertension, hyperlipidemia, and diabetes mellitus who presents  for further evaluation of slurred speech and a fall.  Family states on  06-02-14  he slid out of bed, and his brother found him when he got home  from work. MS has had progressive decline since February. Currently unable  to ambulate and requires assistance. Family has also noted decreased PO  intake and difficulty swallowing over the past few months. MRI on 06-03-14  negative for acute intracranial abnormality, with progressed chronic  demyelinating disease and interrnal small vessel ischemia  No Data Recorded  Assessment / Plan / Recommendation CHL IP CLINICAL IMPRESSIONS 06/04/2014  Dysphagia Diagnosis Mild oral phase dysphagia;Mild pharyngeal phase  dysphagia  Clinical impression Pt presents with sensory based mild oral pharyngeal  dysphagia. No penetration or aspiration noted with thin or regular  consistencies trialed. Pt with delayed swallow initiation allowing thin  liquid bolus consistencies traveling to the level of the pyriform sinuses  with larger cup sips. However good airway protection evidenced throughout  the study. Pt with inconsistent bolus control due to oral phase  impairments. Recommend regular thin diet. Meds whole with thin liquids.  Continued ST interventon indicated for diet tolerance along with  education/training of safe swallow strategies as patient exhibits  impulsivity with PO.        CHL  IP TREATMENT RECOMMENDATION 06/04/2014  Treatment Plan Recommendations Therapy as outlined in treatment plan below      CHL IP DIET RECOMMENDATION 06/04/2014  Diet Recommendations Regular;Thin liquid  Liquid Administration via Straw;Cup  Medication Administration Whole meds with liquid  Compensations Slow rate  Postural Changes and/or Swallow Maneuvers Seated upright 90 degrees     CHL IP OTHER RECOMMENDATIONS 06/04/2014  Recommended Consults (None)  Oral Care Recommendations Oral care BID  Other Recommendations (None)     No flowsheet data found.   CHL IP FREQUENCY AND DURATION 06/04/2014  Speech Therapy Frequency (ACUTE ONLY) min 1 x/week  Treatment Duration 1 week     Pertinent Vitals/Pain none    SLP Swallow Goals No flowsheet data found.  No flowsheet data found.    CHL IP REASON FOR REFERRAL 06/04/2014  Reason for Referral Objectively evaluate swallowing function     CHL IP ORAL PHASE 06/04/2014  Lips (None)  Tongue (None)  Mucous membranes (None)  Nutritional status (None)  Other (None)  Oxygen therapy (None)  Oral Phase (None)  Oral - Pudding Teaspoon (None)  Oral - Pudding Cup (None)  Oral - Honey Teaspoon (None)  Oral - Honey Cup (None)  Oral - Honey Syringe (None)  Oral - Nectar Teaspoon (None)  Oral - Nectar Cup (None)  Oral - Nectar Straw (None)  Oral - Nectar Syringe (None)  Oral - Ice Chips (None)  Oral - Thin Teaspoon Holding of bolus  Oral - Thin Cup Holding of bolus;Delayed oral transit  Oral - Thin Straw (None)  Oral - Thin Syringe (None)  Oral - Puree (None)  Oral - Mechanical Soft (None)  Oral - Regular Piecemeal swallowing  Oral - Multi-consistency (None)  Oral - Pill (No Data)  Oral Phase - Comment (None)      CHL IP PHARYNGEAL PHASE 06/04/2014  Pharyngeal  Phase Impaired  Pharyngeal - Pudding Teaspoon (None)  Penetration/Aspiration details (pudding teaspoon) (None)  Pharyngeal - Pudding Cup (None)  Penetration/Aspiration details (pudding cup) (None)  Pharyngeal - Honey Teaspoon (None)   Penetration/Aspiration details (honey teaspoon) (None)  Pharyngeal - Honey Cup (None)  Penetration/Aspiration details (honey cup) (None)  Pharyngeal - Honey Syringe (None)  Penetration/Aspiration details (honey syringe) (None)  Pharyngeal - Nectar Teaspoon (None)  Penetration/Aspiration details (nectar teaspoon) (None)  Pharyngeal - Nectar Cup (None)  Penetration/Aspiration details (nectar cup) (None)  Pharyngeal - Nectar Straw (None)  Penetration/Aspiration details (nectar straw) (None)  Pharyngeal - Nectar Syringe (None)  Penetration/Aspiration details (nectar syringe) (None)  Pharyngeal - Ice Chips (None)  Penetration/Aspiration details (ice chips) (None)  Pharyngeal - Thin Teaspoon Premature spillage to valleculae  Penetration/Aspiration details (thin teaspoon) (None)  Pharyngeal - Thin Cup Premature spillage to pyriform sinuses;Delayed  swallow initiation  Penetration/Aspiration details (thin cup) (None)  Pharyngeal - Thin Straw Delayed swallow initiation;Premature spillage to  pyriform sinuses  Penetration/Aspiration details (thin straw) (None)  Pharyngeal - Thin Syringe (None)  Penetration/Aspiration details (thin syringe') (None)  Pharyngeal - Puree (None)  Penetration/Aspiration details (puree) (None)  Pharyngeal - Mechanical Soft (None)  Penetration/Aspiration details (mechanical soft) (None)  Pharyngeal - Regular Premature spillage to valleculae  Penetration/Aspiration details (regular) (None)  Pharyngeal - Multi-consistency (None)  Penetration/Aspiration details (multi-consistency) (None)  Pharyngeal - Pill (None)  Penetration/Aspiration details (pill) (None)  Pharyngeal Comment (No Data)     CHL IP CERVICAL ESOPHAGEAL PHASE 06/04/2014  Cervical Esophageal Phase WFL  Pudding Teaspoon (None)  Pudding Cup (None)  Honey Teaspoon (None)  Honey Cup (None)  Honey Syringe (None)  Nectar Teaspoon (None)  Nectar Cup (None)  Nectar Straw (None)  Nectar Syringe (None)  Thin Teaspoon (None)  Thin Cup (None)  Thin  Straw (None)  Thin Syringe (None)  Cervical Esophageal Comment (None)    CHL IP GO 06/04/2014  Functional Assessment Tool Used (None)  Functional Limitations Swallowing  Swallow Current Status (N4627) (None)  Swallow Goal Status (O3500) (None)  Swallow Discharge Status (X3818) (None)  Motor Speech Current Status (E9937) (None)  Motor Speech Goal Status (J6967) (None)  Motor Speech Goal Status (E9381) (None)  Spoken Language Comprehension Current Status (O1751) (None)  Spoken Language Comprehension Goal Status (W2585) (None)  Spoken Language Comprehension Discharge Status (I7782) (None)  Spoken Language Expression Current Status (U2353) (None)  Spoken Language Expression Goal Status (I1443) (None)  Spoken Language Expression Discharge Status [phone]) (None)  Attention Current Status (Q6761) (None)  Attention Goal Status (P5093) (None)  Attention Discharge Status (O6712) (None)  Memory Current Status (W5809) (None)  Memory Goal Status (X8338) (None)  Memory Discharge Status (S5053) (None)  Voice Current Status (Z7673) (None)  Voice Goal Status (A1937) (None)  Voice Discharge Status (T0240) (None)  Other Speech-Language Pathology Functional Limitation [phone]) (None)  Other Speech-Language Pathology Functional Limitation Goal Status (G9924)  (None)  Other Speech-Language Pathology Functional Limitation Discharge Status  [phone]) (None)           Royce Macadamia 06/04/2014, 12:24 PM   Breck Coons Lonell Face.Ed CCC-SLP Pager [phone]       Medications:  Scheduled Meds: . antiseptic oral rinse  7 mL Mouth Rinse q12n4p  . aspirin EC  81 mg Oral Daily  . atorvastatin  40 mg Oral QHS  . buPROPion  150 mg Oral BID  . cefTRIAXone (ROCEPHIN)  IV  1 g Intravenous Q24H  . chlorhexidine  15 mL Mouth Rinse BID  .  cholecalciferol  1,000 Units Oral Daily  . Dimethyl Fumarate  1 capsule Oral BID  . heparin  5,000 Units Subcutaneous 3 times per day  . insulin aspart  0-9 Units Subcutaneous TID WC  . multivitamin with  minerals  1 tablet Oral Daily  . pantoprazole  40 mg Oral Q0600   Continuous Infusions: . sodium chloride 75 mL/hr at 06/05/14 1444   PRN Meds:   Antibiotics: Antibiotics Given (last 72 hours)    Date/Time Action Medication Dose Rate   06/03/14 2340 Given   cefTRIAXone (ROCEPHIN) 1 g in dextrose 5 % 50 mL IVPB - Premix 1 g 100 mL/hr   06/04/14 2000 Given   cefTRIAXone (ROCEPHIN) 1 g in dextrose 5 % 50 mL IVPB - Premix 1 g 100 mL/hr   06/05/14 0243 Given   piperacillin-tazobactam (ZOSYN) IVPB 3.375 g 3.375 g 12.5 mL/hr   06/05/14 0243 Given   vancomycin (VANCOCIN) IVPB 1000 mg/200 mL premix 1,000 mg 200 mL/hr   06/05/14 1610 Given   piperacillin-tazobactam (ZOSYN) IVPB 3.375 g 3.375 g 12.5 mL/hr   06/05/14 9604 Given   vancomycin (VANCOCIN) IVPB 750 mg/150 ml premix 750 mg 150 mL/hr   06/05/14 1350 Given   piperacillin-tazobactam (ZOSYN) IVPB 3.375 g 3.375 g 12.5 mL/hr      Day of Hospitalization:   Consults:    Assessment/Plan:   Principal Problem:   Sepsis due to urinary tract infection Active Problems:   Diabetes mellitus   Essential hypertension, benign   Dyslipidemia   Multiple sclerosis, relapsing-remitting   Sepsis likely due to UTI UA suggests UTI and likely source of generalized weakness and leukocytosis. Lungs are CTAB so PNA unlikely source of sepsis and CXR wnl.  Was on ceftriaxone but spiked a fever of 103 last PM and was started on vanc/zosyn.  Leukocytosis is downtrending so would resume ceftriaxone and d/c broadened coverage.   -resume ceftriaxone  -d/c vanc/zosyn  -Blood and urine cx pending   RRMS Pt has not seen a neurologist in a while and has been followed by the [facility] physician. Has not been on prednisone since 2014. He is on tecfidera for MS which he recently restarted. But his sister states this causes slurred speech. He is also on enablex for bladder spasms at home but being held here d/t anticholinergic effects.  Neurology consulted  recommends tx underlying infection likely pseudo flair.  PT/OT recommends SNF if family uncomfortable with continuing care but he is also a [facility] patient.   -cont PT/OT  -cont tecfidera -appreciate neuro recs   Dysphagia Pt failed initial swallow study in the ED.  SLP recommended MBS and now on carb mod diet.     -cont IVF for now -->will see how much he is eating   Hypertension  Stable.  -cont hold meds    Diabetes Mellitus II  Pt on metformin 1000mg /day and lantus for home meds.  -d/c metformin and lantus  -SSI-S, ac and hs cbg  GERD  Pt on chronic PPI at home. -continue home meds  Dyslipidemia  Pt is on lipitor 40mg  qhs at home.  -continue statin therapy  Substance abuse  Pt has a h/o tobacco use -smoking cessation   Fall  Unclear if syncopal episode. Has h/o prior falls. No known h/o seizures. No cardiac history. Denies dizziness, lightheadedness, palpitations, diaphoresis, N/V. Unfortunately orthostatics were not checked prior to IVF.  EKG without acute changes, trop neg, UDS neg.   -cont PT/OT   FEN Fluids-NS  Electrolytes-replete  PRN  Nutrition- Carb Mod   Disposition Disposition is deferred, awaiting improvement of current medical problems.  Anticipated discharge in approximately 1-2 day(s).   LOS: 1 day   Marrian Salvage, MD PGY-2, Internal Medicine Teaching Service 06/05/2014, 4:27 PM

## 2014-06-05 NOTE — Clinical Social Work Note (Signed)
CSW received call from MD regarding patient's disposition. BSW intern initiated SNF search for patient (patient has been offered a bed at [facility] [place] if needed), but MD shares that the patient's sister is stating that she would like to take the patient home at discharge. CSW has left message with RNCM regarding possible change in disposition. CSW will leave report for weekend CSW.    Roddie Mc MSW, [place], [place], [phone]

## 2014-06-05 NOTE — Progress Notes (Signed)
Speech Language Pathology Treatment: Dysphagia  Patient Details Name: Mario Shields MRN: [id number] DOB: 12-30-1960 Today's Date: 06/05/2014 Time: 9147-8295 SLP Time Calculation (min) (ACUTE ONLY): 24 min  Assessment / Plan / Recommendation Clinical Impression  Skilled treatment session focused on addressing dysphagia goals.  Patient unable to recall any safe swallow precautions despite Max question cues.  SLP reviewed recommendations for up right posture, small portions and a slow pace of PO intake with patient, who continued to require Mod multimodal cues throughout session to utilize strategies in the moment.  When SLP faded to cues to every 3-4 sips patient increased speed which resulted in immediate reflexive cough response.  Recommend full supervision with PO for cues every other bite/sip to maximize safety with PO intake.  SLP to continue to follow and provide family education prior to discharge home with 24/7 assist.    HPI HPI: 54 yo man with a PMH significant for multiple sclerosis (initial dx 1990), hypertension, hyperlipidemia, and diabetes mellitus who presents for further evaluation of slurred speech and a fall.  Family states on 06-02-14  he slid out of bed, and his brother found him when he got home from work. MS has had progressive decline since February. Currently unable to ambulate and requires assistance. Family has also noted decreased PO intake and difficulty swallowing over the past few months. MRI on 06-03-14 negative for acute intracranial abnormality, with progressed chronic demyelinating disease and interrnal small vessel ischemia   Pertinent Vitals Pain Assessment: No/denies pain  SLP Plan  Continue with current plan of care    Recommendations Diet recommendations: Regular;Thin liquid Liquids provided via: Cup;Straw Medication Administration: Whole meds with liquid (one at a time) Supervision: Full supervision/cueing for compensatory strategies;Patient able to self  feed Compensations: Slow rate;Small sips/bites Postural Changes and/or Swallow Maneuvers: Seated upright 90 degrees              Oral Care Recommendations: Oral care BID Follow up Recommendations: 24 hour supervision/assistance Plan: Continue with current plan of care    GO    Mario Shields., CCC-SLP [phone]  Mario Shields 06/05/2014, 11:29 AM

## 2014-06-06 LAB — CBC WITH DIFFERENTIAL/PLATELET
BASOS ABS: 0 10*3/uL (ref 0.0–0.1)
BASOS PCT: 0 % (ref 0–1)
Eosinophils Absolute: 0.1 10*3/uL (ref 0.0–0.7)
Eosinophils Relative: 0 % (ref 0–5)
HEMATOCRIT: 36.2 % — AB (ref 39.0–52.0)
Hemoglobin: 12.4 g/dL — ABNORMAL LOW (ref 13.0–17.0)
Lymphocytes Relative: 21 % (ref 12–46)
Lymphs Abs: 3.6 10*3/uL (ref 0.7–4.0)
MCH: 29.7 pg (ref 26.0–34.0)
MCHC: 34.3 g/dL (ref 30.0–36.0)
MCV: 86.8 fL (ref 78.0–100.0)
MONO ABS: 2.2 10*3/uL — AB (ref 0.1–1.0)
Monocytes Relative: 13 % — ABNORMAL HIGH (ref 3–12)
Neutro Abs: 10.9 10*3/uL — ABNORMAL HIGH (ref 1.7–7.7)
Neutrophils Relative %: 66 % (ref 43–77)
PLATELETS: 180 10*3/uL (ref 150–400)
RBC: 4.17 MIL/uL — ABNORMAL LOW (ref 4.22–5.81)
RDW: 13.2 % (ref 11.5–15.5)
WBC: 16.7 10*3/uL — AB (ref 4.0–10.5)

## 2014-06-06 LAB — BASIC METABOLIC PANEL
Anion gap: 9 (ref 5–15)
BUN: 6 mg/dL (ref 6–23)
CO2: 22 mmol/L (ref 19–32)
CREATININE: 0.76 mg/dL (ref 0.50–1.35)
Calcium: 8.9 mg/dL (ref 8.4–10.5)
Chloride: 107 mmol/L (ref 96–112)
GFR calc Af Amer: >90 mL/min (ref >90)
GFR calc non Af Amer: >90 mL/min (ref >90)
Glucose, Bld: 121 mg/dL — ABNORMAL HIGH (ref 70–99)
Potassium: 3.4 mmol/L — ABNORMAL LOW (ref 3.5–5.1)
Sodium: 138 mmol/L (ref 135–145)

## 2014-06-06 LAB — URINE CULTURE

## 2014-06-06 LAB — GLUCOSE, CAPILLARY
GLUCOSE-CAPILLARY: 114 mg/dL — AB (ref 70–99)
GLUCOSE-CAPILLARY: 171 mg/dL — AB (ref 70–99)
Glucose-Capillary: 158 mg/dL — ABNORMAL HIGH (ref 70–99)
Glucose-Capillary: 130 mg/dL — ABNORMAL HIGH (ref 70–99)

## 2014-06-06 LAB — MAGNESIUM: Magnesium: 2.3 mg/dL (ref 1.5–2.5)

## 2014-06-06 MED ORDER — CIPROFLOXACIN HCL 500 MG PO TABS
500.0000 mg | ORAL_TABLET | Freq: Two times a day (BID) | ORAL | Status: DC
  Administered 2014-06-06 – 2014-06-07 (×4): 500 mg via ORAL
  Filled 2014-06-06 (×5): qty 1

## 2014-06-06 MED ORDER — POLYETHYLENE GLYCOL 3350 17 G PO PACK
17.0000 g | PACK | Freq: Once | ORAL | Status: AC
  Administered 2014-06-06: 17 g via ORAL
  Filled 2014-06-06: qty 1

## 2014-06-06 MED ORDER — DOCUSATE SODIUM 100 MG PO CAPS
100.0000 mg | ORAL_CAPSULE | Freq: Two times a day (BID) | ORAL | Status: DC
  Administered 2014-06-06 – 2014-06-07 (×2): 100 mg via ORAL
  Filled 2014-06-06 (×3): qty 1

## 2014-06-06 MED ORDER — POTASSIUM CHLORIDE CRYS ER 20 MEQ PO TBCR
40.0000 meq | EXTENDED_RELEASE_TABLET | Freq: Once | ORAL | Status: AC
  Administered 2014-06-06: 40 meq via ORAL
  Filled 2014-06-06: qty 2

## 2014-06-06 NOTE — Progress Notes (Signed)
ANTIBIOTIC CONSULT NOTE - INITIAL  Pharmacy Consult for Cipro Indication: UTI  No Known Allergies  Patient Measurements: Height:  (172.7 cm) Weight: 167 lb (75.751 kg) IBW/kg (Calculated) : 68.4  Vital Signs: Temp: 98.8 F (37.1 C) (04/09 0539) Temp Source: Oral (04/09 0539) BP: 111/73 mmHg (04/09 0539) Pulse Rate: 75 (04/09 0539) Intake/Output from previous day: 04/08 0701 - 04/09 0700 In: 2135 [P.O.:960; I.V.:1125; IV Piggyback:50] Out: 1900 [Urine:1900] Intake/Output from this shift:    Labs:  Recent Labs  06/03/14 1050  06/03/14 1631 06/04/14 0542 06/05/14 0447 06/06/14 0502  WBC  --   < > 25.0* 26.3* 23.4* 16.7*  HGB 17.7*  --  15.9 14.5 13.4 12.4*  PLT  --   < > 214 205 181 180  CREATININE 0.80  --  0.78  --   --  0.76  < > = values in this interval not displayed. Estimated Creatinine Clearance: 100.9 mL/min (by C-G formula based on Cr of 0.76). No results for input(s): VANCOTROUGH, VANCOPEAK, VANCORANDOM, GENTTROUGH, GENTPEAK, GENTRANDOM, TOBRATROUGH, TOBRAPEAK, TOBRARND, AMIKACINPEAK, AMIKACINTROU, AMIKACIN in the last 72 hours.   Microbiology: Recent Results (from the past 720 hour(s))  Urine culture     Status: None (Preliminary result)   Collection Time: 06/03/14 10:54 AM  Result Value Ref Range Status   Specimen Description URINE, RANDOM  Final   Special Requests NONE  Final   Colony Count   Final    >=100,000 COLONIES/ML Performed at [facility]    Culture   Final    ESCHERICHIA COLI Performed at [facility]    Report Status PENDING  Incomplete  Culture, blood (routine x 2)     Status: None (Preliminary result)   Collection Time: 06/03/14  8:45 PM  Result Value Ref Range Status   Specimen Description BLOOD LEFT HAND  Final   Special Requests   Final    BOTTLES DRAWN AEROBIC AND ANAEROBIC BLUE RED   Culture   Final           BLOOD CULTURE RECEIVED NO GROWTH TO DATE CULTURE WILL BE HELD FOR 5 DAYS BEFORE  ISSUING A FINAL NEGATIVE REPORT Performed at [facility]    Report Status PENDING  Incomplete  Culture, blood (routine x 2)     Status: None (Preliminary result)   Collection Time: 06/03/14  8:50 PM  Result Value Ref Range Status   Specimen Description BLOOD RIGHT ARM  Final   Special Requests BOTTLES DRAWN AEROBIC ONLY 5CC  Final   Culture   Final           BLOOD CULTURE RECEIVED NO GROWTH TO DATE CULTURE WILL BE HELD FOR 5 DAYS BEFORE ISSUING A FINAL NEGATIVE REPORT Performed at [facility]    Report Status PENDING  Incomplete  Culture, blood (routine x 2)     Status: None (Preliminary result)   Collection Time: 06/05/14 12:32 AM  Result Value Ref Range Status   Specimen Description BLOOD RIGHT HAND  Final   Special Requests BOTTLES DRAWN AEROBIC ONLY 10CC  Final   Culture   Final           BLOOD CULTURE RECEIVED NO GROWTH TO DATE CULTURE WILL BE HELD FOR 5 DAYS BEFORE ISSUING A FINAL NEGATIVE REPORT Performed at [facility]    Report Status PENDING  Incomplete  Culture, blood (routine x 2)     Status: None (Preliminary result)   Collection Time: 06/05/14  12:39 AM  Result Value Ref Range Status   Specimen Description BLOOD LEFT HAND  Final   Special Requests BOTTLES DRAWN AEROBIC ONLY 10CC  Final   Culture   Final           BLOOD CULTURE RECEIVED NO GROWTH TO DATE CULTURE WILL BE HELD FOR 5 DAYS BEFORE ISSUING A FINAL NEGATIVE REPORT Performed at [facility]    Report Status PENDING  Incomplete    Medical History: Past Medical History  Diagnosis Date  . MS (multiple sclerosis)   . Hypertension   . Hyperlipidemia   . Anemia   . Type II diabetes mellitus   . GERD (gastroesophageal reflux disease)   . Depression     Medications:  Scheduled:  . antiseptic oral rinse  7 mL Mouth Rinse q12n4p  . aspirin EC  81 mg Oral Daily  . atorvastatin  40 mg Oral QHS  . buPROPion  150 mg Oral BID  . cefTRIAXone (ROCEPHIN)  IV  1 g Intravenous  Q24H  . chlorhexidine  15 mL Mouth Rinse BID  . cholecalciferol  1,000 Units Oral Daily  . Dimethyl Fumarate  1 capsule Oral BID  . heparin  5,000 Units Subcutaneous 3 times per day  . insulin aspart  0-9 Units Subcutaneous TID WC  . multivitamin with minerals  1 tablet Oral Daily  . pantoprazole  40 mg Oral Q0600  . potassium chloride SA  40 mEq Oral Once   Assessment: 53yoF with Ecoli UTI. Originally treated with Vanc/Zosyn for sepsis 2/2 UTI then transitioned to ceftriaxone. Now pharmacy consulted to dose ciprofloxacin. WBC 16.7, aferile, Tmax/24 100.9.   Goal of Therapy:  Eradication of infection  Plan:  Cipro  PO q12h Dc CTX F/u culture sensitivies Monitor clinical progress  Thank you for allowing pharmacy to be part of this patient's care team  Gonsalo Cuthbertson M. Bravery Ketcham, Pharm.D Clinical Pharmacy Resident Pager: [phone] 06/06/2014 .9:55 AM

## 2014-06-06 NOTE — Progress Notes (Deleted)
Patient discharge teaching given, including activity, diet, follow-up appoints, and medications. Patient verbalized understanding of all discharge instructions. IV access was d/c'd. Vitals are stable. Skin is intact except as charted in most recent assessments. Pt to be escorted out by NT, to be driven home by family. 

## 2014-06-06 NOTE — Progress Notes (Signed)
Subjective:   Pt looks much better today.  He is very talkative today and wishes to return home with [facility] and not go to SNF.  Denies any pain or cough.  Leukocytosis improving.  Eating.  Denies having a BM.    Objective:   Vital signs in last 24 hours: Filed Vitals:   06/05/14 1506 06/05/14 1700 06/05/14 2132 06/06/14 0539  BP: 125/72  112/70 111/73  Pulse: 86  77 75  Temp: 100.7 F (38.2 C) 99.3 F (37.4 C) 99.2 F (37.3 C) 98.8 F (37.1 C)  TempSrc: Oral Oral Oral Oral  Resp: 16  18 19   Height:      Weight:      SpO2: 98%  98% 99%    Weight: Filed Weights   06/03/14 1018  Weight: 75.751 kg (167 lb)    I/Os:  Intake/Output Summary (Last 24 hours) at 06/06/14 1256 Last data filed at 06/06/14 0653  Gross per 24 hour  Intake   2015 ml  Output   1900 ml  Net    115 ml    Physical Exam: Constitutional: Vital signs reviewed.  Patient is sitting in bed in no acute distress and cooperative with exam.   HEENT: [place]/AT Cardiovascular: RRR, no MRG Pulmonary/Chest: normal respiratory effort, no accessory muscle use, CTAB, no wheezes, rales, or rhonchi Abdominal: Soft. +BS, NT/ND Neurological: A&O x2, CN II-XII grossly intact; non-focal exam Extremities: 2+DP b/l, no C/C/E  Skin: Warm, dry and intact  Lab Results:  BMP:  Recent Labs Lab 06/03/14 1631 06/06/14 0502  NA 140 138  K 3.8 3.4*  CL 100 107  CO2 28 22  GLUCOSE 97 121*  BUN 6 6  CREATININE 0.78 0.76  CALCIUM 10.5 8.9    CBC:  Recent Labs Lab 06/05/14 0447 06/06/14 0502  WBC 23.4* 16.7*  NEUTROABS 16.9* 10.9*  HGB 13.4 12.4*  HCT 38.7* 36.2*  MCV 86.4 86.8  PLT 181 180    Coagulation: No results for input(s): LABPROT, INR in the last 168 hours.  CBG:            Recent Labs Lab 06/05/14 0810 06/05/14 1233 06/05/14 1649 06/05/14 2156 06/06/14 0803 06/06/14 1217  GLUCAP 123* 132* 148* 168* 114* 158*           HA1C:      No results for input(s): HGBA1C in the last 168  hours.  Lipid Panel: No results for input(s): CHOL, HDL, LDLCALC, TRIG, CHOLHDL, LDLDIRECT in the last 168 hours.  LFTs:  Recent Labs Lab 06/03/14 1631  AST 22  ALT 37  ALKPHOS 116  BILITOT 1.0  PROT 7.8  ALBUMIN 4.4    Pancreatic Enzymes: No results for input(s): LIPASE, AMYLASE in the last 168 hours.  Lactic Acid/Procalcitonin:  Recent Labs Lab 06/05/14 0032  LATICACIDVEN 0.9  PROCALCITON 1.67    Ammonia: No results for input(s): AMMONIA in the last 168 hours.  Cardiac Enzymes:  Recent Labs Lab 06/03/14 2330  TROPONINI <0.03    EKG: EKG Interpretation  Date/Time:  Wednesday June 03 2014 10:14:36 EDT Ventricular Rate:  114 PR Interval:  183 QRS Duration: 90 QT Interval:  332 QTC Calculation: 457 R Axis:   4 Text Interpretation:  Sinus tachycardia Probable left atrial enlargement Inferior infarct, old SINCE LAST TRACING HEART RATE HAS INCREASED Confirmed by Ethelda Chick  MD, SAM [phone]) on 06/03/2014 4:36:31 PM   BNP: No results for input(s): PROBNP in the last 168 hours.  D-Dimer:  No results for input(s): DDIMER in the last 168 hours.  Urinalysis:  Recent Labs Lab 06/03/14 1054  COLORURINE YELLOW  LABSPEC 1.010  PHURINE 6.0  GLUCOSEU NEGATIVE  HGBUR SMALL*  BILIRUBINUR NEGATIVE  KETONESUR NEGATIVE  PROTEINUR NEGATIVE  UROBILINOGEN 1.0  NITRITE POSITIVE*  LEUKOCYTESUR TRACE*    Micro Results: Recent Results (from the past 240 hour(s))  Urine culture     Status: None (Preliminary result)   Collection Time: 06/03/14 10:54 AM  Result Value Ref Range Status   Specimen Description URINE, RANDOM  Final   Special Requests NONE  Final   Colony Count   Final    >=100,000 COLONIES/ML Performed at [facility]    Culture   Final    ESCHERICHIA COLI Performed at [facility]    Report Status PENDING  Incomplete  Culture, blood (routine x 2)     Status: None (Preliminary result)   Collection Time: 06/03/14  8:45 PM    Result Value Ref Range Status   Specimen Description BLOOD LEFT HAND  Final   Special Requests   Final    BOTTLES DRAWN AEROBIC AND ANAEROBIC BLUE RED   Culture   Final           BLOOD CULTURE RECEIVED NO GROWTH TO DATE CULTURE WILL BE HELD FOR 5 DAYS BEFORE ISSUING A FINAL NEGATIVE REPORT Performed at [facility]    Report Status PENDING  Incomplete  Culture, blood (routine x 2)     Status: None (Preliminary result)   Collection Time: 06/03/14  8:50 PM  Result Value Ref Range Status   Specimen Description BLOOD RIGHT ARM  Final   Special Requests BOTTLES DRAWN AEROBIC ONLY 5CC  Final   Culture   Final           BLOOD CULTURE RECEIVED NO GROWTH TO DATE CULTURE WILL BE HELD FOR 5 DAYS BEFORE ISSUING A FINAL NEGATIVE REPORT Performed at [facility]    Report Status PENDING  Incomplete  Culture, blood (routine x 2)     Status: None (Preliminary result)   Collection Time: 06/05/14 12:32 AM  Result Value Ref Range Status   Specimen Description BLOOD RIGHT HAND  Final   Special Requests BOTTLES DRAWN AEROBIC ONLY 10CC  Final   Culture   Final           BLOOD CULTURE RECEIVED NO GROWTH TO DATE CULTURE WILL BE HELD FOR 5 DAYS BEFORE ISSUING A FINAL NEGATIVE REPORT Performed at [facility]    Report Status PENDING  Incomplete  Culture, blood (routine x 2)     Status: None (Preliminary result)   Collection Time: 06/05/14 12:39 AM  Result Value Ref Range Status   Specimen Description BLOOD LEFT HAND  Final   Special Requests BOTTLES DRAWN AEROBIC ONLY 10CC  Final   Culture   Final           BLOOD CULTURE RECEIVED NO GROWTH TO DATE CULTURE WILL BE HELD FOR 5 DAYS BEFORE ISSUING A FINAL NEGATIVE REPORT Performed at [facility]    Report Status PENDING  Incomplete    Blood Culture:    Component Value Date/Time   SDES BLOOD LEFT HAND 06/05/2014 0039   SPECREQUEST BOTTLES DRAWN AEROBIC ONLY 10CC 06/05/2014 0039   CULT  06/05/2014 0039            BLOOD CULTURE RECEIVED NO GROWTH TO DATE CULTURE WILL BE HELD FOR 5 DAYS  BEFORE ISSUING A FINAL NEGATIVE REPORT Performed at [facility]    REPTSTATUS PENDING 06/05/2014 1610    Studies/Results: Dg Chest 2 View  06/05/2014   CLINICAL DATA:  Fever. Multiple sclerosis. New slurred speech. Altered mental status. Sepsis. Found to have urinary tract infection but still spiking fevers on treatment. Concern for pneumonia.  EXAM: CHEST  2 VIEW  COMPARISON:  04/15/2012  FINDINGS: The heart size and mediastinal contours are within normal limits. Both lungs are clear. The visualized skeletal structures are unremarkable.  IMPRESSION: No active cardiopulmonary disease.   Electronically Signed   By: Burman Nieves M.D.   On: 06/05/2014 00:44    Medications:  Scheduled Meds: . antiseptic oral rinse  7 mL Mouth Rinse q12n4p  . aspirin EC  81 mg Oral Daily  . atorvastatin  40 mg Oral QHS  . buPROPion  150 mg Oral BID  . chlorhexidine  15 mL Mouth Rinse BID  . cholecalciferol  1,000 Units Oral Daily  . ciprofloxacin  500 mg Oral BID  . Dimethyl Fumarate  1 capsule Oral BID  . heparin  5,000 Units Subcutaneous 3 times per day  . insulin aspart  0-9 Units Subcutaneous TID WC  . multivitamin with minerals  1 tablet Oral Daily  . pantoprazole  40 mg Oral Q0600   Continuous Infusions: . sodium chloride 75 mL/hr at 06/06/14 0438   PRN Meds:   Antibiotics: Antibiotics Given (last 72 hours)    Date/Time Action Medication Dose Rate   06/03/14 2340 Given   cefTRIAXone (ROCEPHIN) 1 g in dextrose 5 % 50 mL IVPB - Premix 1 g 100 mL/hr   06/04/14 2000 Given   cefTRIAXone (ROCEPHIN) 1 g in dextrose 5 % 50 mL IVPB - Premix 1 g 100 mL/hr   06/05/14 0243 Given   piperacillin-tazobactam (ZOSYN) IVPB 3.375 g 3.375 g 12.5 mL/hr   06/05/14 0243 Given   vancomycin (VANCOCIN) IVPB 1000 mg/200 mL premix 1,000 mg 200 mL/hr   06/05/14 9604 Given   piperacillin-tazobactam (ZOSYN) IVPB 3.375 g 3.375 g 12.5  mL/hr   06/05/14 5409 Given   vancomycin (VANCOCIN) IVPB 750 mg/150 ml premix 750 mg 150 mL/hr   06/05/14 1350 Given   piperacillin-tazobactam (ZOSYN) IVPB 3.375 g 3.375 g 12.5 mL/hr   06/05/14 2111 Given   cefTRIAXone (ROCEPHIN) 1 g in dextrose 5 % 50 mL IVPB - Premix 1 g 100 mL/hr   06/06/14 1235 Given   ciprofloxacin (CIPRO) tablet 500 mg 500 mg       Day of Hospitalization:   Consults:    Assessment/Plan:   Principal Problem:   Sepsis due to urinary tract infection (E.coli)  Active Problems:   Diabetes mellitus   Essential hypertension, benign   Dyslipidemia   Multiple sclerosis, relapsing-remitting   Sepsis likely due to UTI (E.coli)  UA suggests UTI and likely source of generalized weakness and leukocytosis. Urine cx growing e.coli.  Sensitivities pending.  Lungs are CTAB so PNA unlikely source of sepsis and CXR wnl.  Leukocytosis is downtrending.   -d/c ceftriaxone today  -switch to po cipro   -Blood cx pending -Urine sensitivities pending  -likely d/c tomorrow   RRMS Pt has not seen a neurologist in a while and has been followed by the [facility] physician. Has not been on prednisone since 2014. He is on tecfidera for MS which he recently restarted. But his sister states this causes slurred speech. He is also on enablex for bladder spasms at home  but being held here d/t anticholinergic effects.  Neurology consulted recommends tx underlying infection likely pseudo flair.  PT/OT recommends but pt prefers to go home with [facility].   -cont PT/OT  -cont tecfidera -appreciate neuro recs   Dysphagia Pt failed initial swallow study in the ED.  SLP recommended MBS and now on carb mod diet.     -d/c IVF as he is eating now  Hypertension  Stable.  -cont hold meds    Diabetes Mellitus II  Pt on metformin /day and lantus for home meds.  -d/c metformin and lantus  -SSI-S, ac and hs cbg  GERD  Pt on chronic PPI at home. -continue home meds  Dyslipidemia   Pt is on lipitor  qhs at home.  -continue statin therapy  Substance abuse  Pt has a h/o tobacco use -smoking cessation   Fall  Unclear if syncopal episode. Has h/o prior falls. No known h/o seizures. No cardiac history. Denies dizziness, lightheadedness, palpitations, diaphoresis, N/V. Unfortunately orthostatics were not checked prior to IVF.  EKG without acute changes, trop neg, UDS neg.   -cont PT/OT   FEN Fluids-None  Electrolytes-replete PRN  Nutrition- Carb Mod   Disposition Anticipated discharge tomorrow with HH.  LOS: 2 days   Marrian Salvage, MD PGY-2, Internal Medicine Teaching Service 06/06/2014, 12:56 PM

## 2014-06-06 NOTE — Progress Notes (Signed)
Subjective: Febrile in the afternoon yesterday to 38.2. Denies any fevers, chills, SOB, or chest pain. Denies abdominal pain, but hasn't had a bowel movement since admission. Reports that he usually has a bowel movement about 1x weekly at home.   Per nursing, is eating about 50% of his most recent meal and has been drinking some fluids.   PT had recommended possible short-term SNF placement, but the patient is adamant about returning to his home.   Objective: Vital signs in last 24 hours: Filed Vitals:   06/05/14 1506 06/05/14 1700 06/05/14 2132 06/06/14 0539  BP: 125/72  112/70 111/73  Pulse: 86  77 75  Temp: 100.7 F (38.2 C) 99.3 F (37.4 C) 99.2 F (37.3 C) 98.8 F (37.1 C)  TempSrc: Oral Oral Oral Oral  Resp: Height:      Weight:      SpO2: 98%  98% 99%   Weight change:   Intake/Output Summary (Last 24 hours) at 06/06/14 0704 Last data filed at 06/06/14 0653  Gross per 24 hour  Intake   2135 ml  Output   1900 ml  Net    235 ml   General: Lying in bed in no acute distress.  CV: RRR Pulm: Somewhat difficult to auscultate 2/2 weakness in sitting forward. CTAB anteriorly.  Abdomen: Active bowel sounds, soft, non-tender.    Lab Results: Basic Metabolic Panel:  Recent Labs  16/10/96 1631 06/06/14 0502  NA 140 138  K 3.8 3.4*  CL 100 107  CO2 28 22  GLUCOSE 97 121*  BUN 6 6  CREATININE 0.78 0.76  CALCIUM 10.5 8.9   CBC:  Recent Labs  06/05/14 0447 06/06/14 0502  WBC 23.4* 16.7*  NEUTROABS 16.9* 10.9*  HGB 13.4 12.4*  HCT 38.7* 36.2*  MCV 86.4 86.8  PLT 181 180  CBG:  Recent Labs  06/04/14 2222 06/05/14 0810 06/05/14 1233 06/05/14 1649 06/05/14 2156 06/06/14 0803  GLUCAP 126* 123* 132* 148* 168* 114*   Micro Results: Reviewed in EPIC. Please see chart for full details.   Studies/Results: Reviewed in EPIC. Please see chart for full details.   Medications:  Scheduled Meds: . antiseptic oral rinse  7 mL Mouth Rinse q12n4p  .  aspirin EC  81 mg Oral Daily  . atorvastatin  40 mg Oral QHS  . buPROPion  150 mg Oral BID  . cefTRIAXone (ROCEPHIN)  IV  1 g Intravenous Q24H  . chlorhexidine  15 mL Mouth Rinse BID  . cholecalciferol  1,000 Units Oral Daily  . Dimethyl Fumarate  1 capsule Oral BID  . heparin  5,000 Units Subcutaneous 3 times per day  . insulin aspart  0-9 Units Subcutaneous TID WC  . multivitamin with minerals  1 tablet Oral Daily  . pantoprazole  40 mg Oral Q0600   Continuous Infusions: . sodium chloride 75 mL/hr at 06/06/14 0438   PRN Meds:.docusate sodium   Assessment/Plan:Mr. Mario Shields is a 54 yo male with a PMH relevant for multiple sclerosis, diabetes, hypertension, and hyperlipidemia who presents to [facility] for further evaluation management of slurred speech, weakness, lethargy, and E. coli UTI.   Principal Problem:   Sepsis due to urinary tract infection (E.coli)  Active Problems:   Diabetes mellitus   Essential hypertension, benign   Dyslipidemia   Multiple sclerosis, relapsing-remitting  E. coli UTI: Asymptomatic, but did present with tachycardia, altered mental status, and possible orthostasis leading to a fall. Now s/p 3 days of  ceftriaxone with urine culture growing E. coli. Did receive 2 days of vanc/zosyn started for fever of 103 noted overnight on 4/7. Did continue to have fevers yesterday afternoon with improvement in tachycardia and leukocytosis. Continues to deny any symptoms.  -- Will transition to oral ciprofloxacin -- Will f/u on culture sensitivities  -- Blood cultures from 4/6 and 4/8 pending -- Will d/c IVF today with improved PO intake  Multiple sclerosis, now with weakness, slurred Speech, AMS: History of sub-acute slurred speech, AMS, and progressive dysarthria on top of chronic changes including decreased po intake. His MRI is concerning for progression of his MS, but acute flare is unlikely given MRI and per neurology recommendations. SLP has evaluated dysphagia with  barium swallow, and recommends normal diet but continued therapy. Per family's concerns, depression and decreased motivation may be playing a role in his chronic weakness and deconditioning.  -- Neurology consult; appreciate reccs -- Continue home tecfidera -- Will treat for possible infection as above -- Continue PT, OT, and SLP -- Would likely benefit from outpatient psychiatric follow-up   Fall: Most likely mechanical in the setting of weakness and probable UTI.  -- Will treat for possible infection as above  Chronic Disease Mangement: Hypertension, hyperlipidemia, diabetes mellitus type 2. No recent A1c or cholesterol panel. Follows with [facility] for primary care needs.  -- Hold home valsartan-HCTZ -- Holding home metformin; on SSI -- Continue home aspirin, atorvastatin, bupropion   FEN/GI:  -- Home cholecalciferol  -- Potassium is 3.4; will replete today -- Constipation: on PRN colace; will add PRN miralax today PPx:  -- DVT ppx: heparin TID -- GI ppx: home pantoprazole   Dispo:  -- Will need follow-up with his PCP at [facility] and with Dr. Marjory Lies with neurology -- Would likely benefit from referral to psychiatry  -- Care management consult for assistance in coordinating outpatient home health, PT/OT/SLP with [facility]  This is a Medical Student Note.  The care of the patient was discussed with Dr. Delane Ginger and the assessment and plan formulated with their assistance.  Please see their attached note for official documentation of the daily encounter.   LOS: 2 days   [name], Med Student 06/06/2014, 7:04 AM  [phone]

## 2014-06-07 LAB — BASIC METABOLIC PANEL
ANION GAP: 12 (ref 5–15)
BUN: 8 mg/dL (ref 6–23)
CHLORIDE: 110 mmol/L (ref 96–112)
CO2: 18 mmol/L — ABNORMAL LOW (ref 19–32)
Calcium: 9.4 mg/dL (ref 8.4–10.5)
Creatinine, Ser: 0.65 mg/dL (ref 0.50–1.35)
GFR calc Af Amer: >90 mL/min (ref >90)
GFR calc non Af Amer: >90 mL/min (ref >90)
GLUCOSE: 123 mg/dL — AB (ref 70–99)
Potassium: 4.2 mmol/L (ref 3.5–5.1)
Sodium: 140 mmol/L (ref 135–145)

## 2014-06-07 LAB — CBC
HCT: 37.6 % — ABNORMAL LOW (ref 39.0–52.0)
Hemoglobin: 12.7 g/dL — ABNORMAL LOW (ref 13.0–17.0)
MCH: 29.3 pg (ref 26.0–34.0)
MCHC: 33.8 g/dL (ref 30.0–36.0)
MCV: 86.8 fL (ref 78.0–100.0)
Platelets: 192 10*3/uL (ref 150–400)
RBC: 4.33 MIL/uL (ref 4.22–5.81)
RDW: 13.2 % (ref 11.5–15.5)
WBC: 11.1 10*3/uL — ABNORMAL HIGH (ref 4.0–10.5)

## 2014-06-07 LAB — GLUCOSE, CAPILLARY
GLUCOSE-CAPILLARY: 85 mg/dL (ref 70–99)
Glucose-Capillary: 126 mg/dL — ABNORMAL HIGH (ref 70–99)

## 2014-06-07 MED ORDER — CIPROFLOXACIN HCL 500 MG PO TABS: 500.0000 mg | ORAL_TABLET | Freq: Two times a day (BID) | ORAL | Status: AC

## 2014-06-07 MED ORDER — DOCUSATE SODIUM 100 MG PO CAPS: 100.0000 mg | ORAL_CAPSULE | Freq: Two times a day (BID) | ORAL | Status: DC

## 2014-06-07 MED ORDER — VALSARTAN 80 MG PO TABS: 80.0000 mg | ORAL_TABLET | Freq: Every day | ORAL | Status: AC

## 2014-06-07 NOTE — Discharge Instructions (Signed)
Please keep your follow-up appointments; this is very important for your continued recovery.    We have made the following additions/changes to your medications:  Please refer to your medication list.  We have decreased your diovan to  daily for blood pressure. Please take your cipro for a urinary tract infection beginning tomorrow twice daily for two days.    Please continue to take all of your medications as prescribed.  Do not miss any doses without contacting your primary physician.  If you have questions, please contact your physician or contact the Internal Medicine Teaching Service at [phone].  Please bring your medicications with you to your appointments; medications may be eye drops, herbals, vitamins, or pills.    If you believe you are suffering from a life-threatening emergency, go to the nearest Emergency Department.      [employer] Guide  1) Find a Scientist, water quality Although you won't have to find out who is covered by [employer] plan, it is a good idea to ask around and get recommendations. You will then need to call the office and see if the doctor you have chosen will accept you as a new patient and what types of options they offer for patients who are self-pay. Some doctors offer discounts or will set up payment plans for their patients who do not have insurance, but you will need to ask so you aren't surprised when you get to your appointment.  2) Contact Your Local Health Department Not all health departments have doctors that can see patients for sick visits, but many do, so it is worth a call to see if yours does. If you don't know where your local health department is, you can check in your phone book. The CDC also has a tool to help you locate your state's health department, and many state websites also have listings of all of their local health departments.  3) Find a Walk-in Clinic If your illness is not likely to be very severe or  complicated, you may want to try a walk in clinic. These are popping up all over the country in pharmacies, drugstores, and shopping centers. They're usually staffed by nurse practitioners or physician assistants that have been trained to treat common illnesses and complaints. They're usually fairly quick and inexpensive. However, if you have serious medical issues or chronic medical problems, these are probably not your best option.  No Primary Care Doctor: - Call Health Connect at  [phone] - they can help you locate a primary care doctor that  accepts your insurance, provides certain services, etc. - Physician Referral Service- [phone]  Chronic Pain Problems: Organization         Address  Phone   Notes  [facility]  [phone] Patients need to be referred by their primary care doctor.   Medication Assistance: Organization         Address  Phone   Notes  [facility] Med[facility] [address] [place] [place], [place] [address] [phone] --Must be a resident of [facility] -- Must have NO insurance coverage whatsoever (no Medicaid/ Medicare, etc.) -- The pt. MUST have a primary care doctor that directs their care regularly and follows them in the community   MedAssist  [phone]   [employer]  [phone]    Agencies that provide inexpensive medical care: Retail buyer  Notes  [facility]  [phone]) [phone]   [facility]    [phone]) [phone]   [facility] [address] [place], [place] [address] [phone]) [phone]   [facility] [place] [address], [place] [phone](336) [phone]   Planned Parenthood    [phone](336) [phone]   [facility]    [phone](336) [phone]   [facility]  [address], [place] Phone:  [phone], Fax:  [phone] Hours of Operation:  9 am - 6 pm, M-F.  Also accepts  Medicaid/Medicare and self-pay.  [facility]  [address], [place] Phone: [phone]) [phone], Fax: [phone]) [phone]. Hours of Operation:  8:30 am - 5:30 pm, M-F.  Also accepts Medicaid and self-pay.  [facility] [place] [address], [place] Phone: [phone](336) [phone]   [facility] [address], [place], [place] [phone], Ext. [phone] Mondays & Thursdays: 7-9 AM.  First 15 patients are seen on a first come, first serve basis.    Medicaid-accepting [facility] Providers:  Organization         Address  Phone   Notes  [facility] [address], [place] [phone]) [phone] Also accepts self-pay patients.  [facility] [address], [place]  [phone]) [phone]   [facility] [address], [place] [phone]) [phone]   [facility] [address] [place] [address], [place] [phone]) [phone]   Renaye RakersVeita Bland [address], [place]   [phone](336) [phone] Only accepts [place] Access [place]icaid patients after they have their name applied to their card.   Self-Pay (no insurance) in [facility]:  Organization         Address  Phone   Notes  Sickle Cell Patients, [facility] [address], [place] [phone]) [phone]   [facility] [address], [place] [phone]) [phone]   [facility] [place]  [address], [place] [phone]) [phone]   [facility]/Dr. Osei-Bonsu  [address] [place] [address], [place] or [phone], [place] [phone]) [phone] Phone number for both [place] and [place] locations is the same.  [facility] [address], [place] [phone]) [phone]   [facility] [address] [place] [address], [place] or [address] [phone]) [phone]   [facility] [address], [place] [phone](336)  [phone], phone; [phone](336) [phone], fax Sees patients 1st and 3rd Saturday of every month.  Must not qualify for public or private insurance (i.e. Medicaid, Medicare, [place] Health Choice, Veterans' Benefits)  Household income should be no more than 200% of the poverty level The clinic cannot treat you if you are pregnant or think you are pregnant  Sexually transmitted diseases are not treated at the clinic.    Dental Care: Organization         Address  Phone  Notes  [facility] Department of [facility] [address], [place] [phone] Accepts children up to age 56 who are enrolled in [place]icaid or [employer] Health Choice; pregnant women with a Medicaid card; and children who have applied for Medicaid or [place] Health Choice, but were declined, whose parents can pay a reduced fee at time of service.  [facility]  Department of [facility] [place]  [address], [place] [phone]) [phone] Accepts children up to age 10921 who are enrolled in [place] or [place] Health Choice; pregnant women with a Medicaid card; and children who have applied for Medicaid or [place] Health Choice, but were declined, whose parents can pay a reduced fee at time of service.  [facility]  [address], [place] [phone](336) [phone] Patients are seen by appointment only. Walk-ins are not accepted. [facility] will see patients 56 years of age and older. Monday - Tuesday (8am-5pm) Most Wednesdays (8:30-5pm) $30 per visit, cash only  [facility]  [address], [facility] [phone]) [phone] Patients are seen by appointment only. Walk-ins are not accepted. [facility] will see patients 56 years of age and older. One Wednesday Evening (Monthly: Volunteer Based).  $30 per visit, cash only  [employer] [facility] [facility]  [phone] for adults; Children under age 444, call Graduate Pediatric Dentistry at [phone]. Children aged  56-14, please call [phone] to request a pediatric application.  Dental services are provided in all areas of dental care including fillings, crowns and bridges, complete and partial dentures, implants, gum treatment, root canals, and extractions. Preventive care is also provided. Treatment is provided to both adults and children. Patients are selected via a lottery and there is often a waiting list.   [facility] [address], [place]  [phone]) [phone] [email or web address]   [facility] [address], [place], [place] [phone], Ext. [phone] Second and Fourth Thursday of each month, opens at 6:30 AM; Clinic ends at 9 AM.  Patients are seen on a first-come first-served basis, and a limited number are seen during each clinic.   [facility]  [address], [place], [place] [phone]) [phone]   Eligibility Requirements You must have lived in [place], [place], or [place] counties for at least the last three months.   You cannot be eligible for state or federal sponsored National Cityhealthcare insurance, including CIGNAVeterans Administration, [place]icaid, or [employer]Medicare.   You generally cannot be eligible for healthcare insurance through your employer.    How to apply: Eligibility screenings are held every Tuesday and Wednesday afternoon from 1:00 pm until 4:00 pm. You do not need an appointment for the interview!  [facility] [address], [place], [place] [phone]   [facility]  [phone]   [facility]  [phone]   [facility]  [phone]    Behavioral Health Resources in the Community: Intensive Outpatient Programs Organization         Address  Phone  Notes  [facility] [address], [place], [place] [phone]   [facility] [address], [place], [place] [phone]   [facility]: [facility] [address],  [place], [place]  [phone]   [facility] [address],  [place], [place] [phone] or [phone]   Substance Abuse Resources Organization         Address  Phone  Notes  [facility] Drug [facility]  [phone]   [facility]  [phone]   [facility]  [phone]   [facility]  [phone]   Residential & Outpatient Substance Abuse Program  [phone]   Psychological Services Organization         Address  Phone  Notes  [facility]  [phone]- [phone]   [facility]  [phone]- [phone]   [facility] [address], [place] [phone] or [phone]    Mobile Crisis Teams Organization         Address  Phone  Notes  [facility], Mobile Crisis Care Unit  [phone]   [facility]  [address] [place], [place] [phone]   Doristine LocksSharon DeEsch [address] [place] [phone]    Self-Help/Support Groups Organization         Address  Phone             Notes  [facility] [place] - variety of support groups  336- [phone] Call for more information  Narcotics Anonymous (NA), [facility]aring [facility] [address], [place]  2 meetings at this location   Statisticianesidential Treatment Programs Organization         Address  Phone  Notes  [facility] [address],    [place]  [phone]   [facility]  [address], [place] [phone], [place], [place] [phone]   [facility] [address], [place] [phone] Admissions: 8am-3pm M-F  [facility] [address]    [place], [place] [phone]   [facility] [address], [place], [place] [phone]   [facility] [address]  [place], [place] [phone]   [facility] - Intensive Outpatient [address], [place], [place] [phone]   [facility]  (Addiction Recovery Care [facility] [address]  [place], [place] [phone] or [phone]   [facility] ([facility]) [address] [place], [place] [phone] Accepts Medicaid  [facility] [address]  [place] [phone] Substance Abuse/Addiction Treatment   [facility] Behavioral Health Resources Organization         Address  Phone  Notes  [facility]  [phone]   Angie FavaJulie Brannon, PhD [address], [address] [place], [place]   [phone]) [phone] or [phone]   [facility] [place]   [address] [place], [place] [phone]   [facility] [address], [place], [place] [phone]) [phone] Insurance/Medicaid/sponsorship through [facility]  [facility] [address]                                    [place], [place] [phone] Therapy/tele-psych/case  [facility] [address]   [place], [place] [phone]    Dr. Lolly MustacheArfeen  [phone]) [phone]   [facility] [place]  [employer] [facility] 1) [address], [place] 2) [address], [place] 3)  [address], [place] [phone](336) [phone]  [phone](336) [phone]   [facility] Child Abuse Hotline [phone](336) [phone] or [phone](336) [phone] (After Hours)

## 2014-06-07 NOTE — Care Management Note (Addendum)
    Page 1 of 1   06/09/2014     1:43:37 PM CARE MANAGEMENT NOTE 06/09/2014  Patient:  Mario Shields, Mario Shields   Account Number:  [id number]  Date Initiated:  06/07/2014  Documentation [facility]:  [facility]  Subjective/Objective Assessment:   adm: fell out of his bed the night prior to admission and was noted to be sleepy and lethargic     Action/Plan:   discharge planning   Anticipated DC Date:  06/07/2014   Anticipated DC Plan:  [place]  CM consult  [facility]      Choice offered to / List presented to:             Status of service:   Medicare Important Message given?   (If response is "NO", the following Medicare IM given date fields will be blank) Date Medicare IM given:   Medicare IM given by:   Date Additional Medicare IM given:   Additional Medicare IM given by:    Discharge Disposition:  HOME/SELF CARE  Per UR Regulation:    If discussed at Long Length of Stay Meetings, dates discussed:    Comments:  06/09/14 08:00 CM notes pt is already active with [facility] who will provide PT/OT/SLP per MD request.  CM faxed H&P, consult from MD, progress note to [facility] Attn: [place] per [facility] rep request.  CM called pt's family to explain pt will have services rendered by [facility]. CM called [facility] to cancel request for services. NO other Cm needs were communicated.  Tempie Hoist, BSN, [place]. 06/07/14 09:31 CM received call from MD to please arrange PT[facility]OT[facility] at [facility].  Cm met with pt and gave pt a map handout and a written directions for [facility] and contact number is on AVS. Pt understands if [facility] has not called hime by 14:00 06/08/14, he will call them to get an appt.  CM faxed facesheet with consult request and asked they call pt with appt time. CM called [facility] DME rep, Jeneen Rinks to please arrange for hospital delivery.  No other CM needs were communicated.  Mariane Masters, BSN, CM [phone].

## 2014-06-07 NOTE — Progress Notes (Signed)
Subjective:   Mario Shields continues to improve and much more talkative today.  Denies N/V, abdominal pain, CP, SOB, or dysuria.  Eating well, still no BM.     Objective:   Vital signs in last 24 hours: Filed Vitals:   06/06/14 1518 06/06/14 2004 06/07/14 0024 06/07/14 0416  BP: 125/77 113/75 113/70 121/73  Pulse: 83 84 84 74  Temp: 99.7 F (37.6 C) 99.5 F (37.5 C) 98.1 F (36.7 C) 98.4 F (36.9 C)  TempSrc: Oral Oral Oral Oral  Resp: 20 18 18 18   Height:      Weight:      SpO2: 97% 93% 96% 100%    Weight: Filed Weights   06/03/14 1018  Weight: 75.751 kg (167 lb)    I/Os:  Intake/Output Summary (Last 24 hours) at 06/07/14 1204 Last data filed at 06/07/14 0900  Gross per 24 hour  Intake    240 ml  Output    820 ml  Net   -580 ml    Physical Exam: Constitutional: Vital signs reviewed.  Patient is sitting in bed in no acute distress and cooperative with exam.   HEENT: [place]/AT Cardiovascular: RRR, no MRG Pulmonary/Chest: normal respiratory effort, no accessory muscle use, CTAB, no wheezes, rales, or rhonchi Abdominal: Soft. +BS, NT/ND Neurological: A&O x2, CN II-XII grossly intact; non-focal exam Extremities: 2+DP b/l, no C/C/E  Skin: Warm, dry and intact  Lab Results:  BMP:  Recent Labs Lab 06/06/14 0502 06/06/14 2010 06/07/14 0753  NA 138  --  140  K 3.4*  --  4.2  CL 107  --  110  CO2 22  --  18*  GLUCOSE 121*  --  123*  BUN 6  --  8  CREATININE 0.76  --  0.65  CALCIUM 8.9  --  9.4  MG  --  2.3  --     CBC:  Recent Labs Lab 06/05/14 0447 06/06/14 0502 06/07/14 0753  WBC 23.4* 16.7* 11.1*  NEUTROABS 16.9* 10.9*  --   HGB 13.4 12.4* 12.7*  HCT 38.7* 36.2* 37.6*  MCV 86.4 86.8 86.8  PLT 181 180 192    Coagulation: No results for input(s): LABPROT, INR in the last 168 hours.  CBG:            Recent Labs Lab 06/05/14 2156 06/06/14 0803 06/06/14 1217 06/06/14 1721 06/06/14 2321 06/07/14 0802  GLUCAP 168* 114* 158* 130* 171*  126*           HA1C:      No results for input(s): HGBA1C in the last 168 hours.  Lipid Panel: No results for input(s): CHOL, HDL, LDLCALC, TRIG, CHOLHDL, LDLDIRECT in the last 168 hours.  LFTs:  Recent Labs Lab 06/03/14 1631  AST 22  ALT 37  ALKPHOS 116  BILITOT 1.0  PROT 7.8  ALBUMIN 4.4    Pancreatic Enzymes: No results for input(s): LIPASE, AMYLASE in the last 168 hours.  Lactic Acid/Procalcitonin:  Recent Labs Lab 06/05/14 0032  LATICACIDVEN 0.9  PROCALCITON 1.67    Ammonia: No results for input(s): AMMONIA in the last 168 hours.  Cardiac Enzymes:  Recent Labs Lab 06/03/14 2330  TROPONINI <0.03    EKG: EKG Interpretation  Date/Time:  Wednesday June 03 2014 10:14:36 EDT Ventricular Rate:  114 PR Interval:  183 QRS Duration: 90 QT Interval:  332 QTC Calculation: 457 R Axis:   4 Text Interpretation:  Sinus tachycardia Probable left atrial enlargement Inferior infarct, [place]  LAST TRACING HEART RATE HAS INCREASED Confirmed by Ethelda Chick  MD, SAM [phone]) on 06/03/2014 4:36:31 PM   BNP: No results for input(s): PROBNP in the last 168 hours.  D-Dimer: No results for input(s): DDIMER in the last 168 hours.  Urinalysis:  Recent Labs Lab 06/03/14 1054  COLORURINE YELLOW  LABSPEC 1.010  PHURINE 6.0  GLUCOSEU NEGATIVE  HGBUR SMALL*  BILIRUBINUR NEGATIVE  KETONESUR NEGATIVE  PROTEINUR NEGATIVE  UROBILINOGEN 1.0  NITRITE POSITIVE*  LEUKOCYTESUR TRACE*    Micro Results: Recent Results (from the past 240 hour(s))  Urine culture     Status: None   Collection Time: 06/03/14 10:54 AM  Result Value Ref Range Status   Specimen Description URINE, RANDOM  Final   Special Requests NONE  Final   Colony Count   Final    >=100,000 COLONIES/ML Performed at [facility]    Culture   Final    ESCHERICHIA COLI Performed at [facility]    Report Status 06/06/2014 FINAL  Final   Organism ID, Bacteria ESCHERICHIA COLI  Final       Susceptibility   Escherichia coli - MIC*    AMPICILLIN <=2 SENSITIVE Sensitive     CEFAZOLIN <=4 SENSITIVE Sensitive     CEFTRIAXONE <=1 SENSITIVE Sensitive     CIPROFLOXACIN <=0.25 SENSITIVE Sensitive     GENTAMICIN <=1 SENSITIVE Sensitive     LEVOFLOXACIN <=0.12 SENSITIVE Sensitive     NITROFURANTOIN <=16 SENSITIVE Sensitive     TOBRAMYCIN <=1 SENSITIVE Sensitive     TRIMETH/SULFA <=20 SENSITIVE Sensitive     PIP/TAZO <=4 SENSITIVE Sensitive     * ESCHERICHIA COLI  Culture, blood (routine x 2)     Status: None (Preliminary result)   Collection Time: 06/03/14  8:45 PM  Result Value Ref Range Status   Specimen Description BLOOD LEFT HAND  Final   Special Requests   Final    BOTTLES DRAWN AEROBIC AND ANAEROBIC BLUE RED   Culture   Final           BLOOD CULTURE RECEIVED NO GROWTH TO DATE CULTURE WILL BE HELD FOR 5 DAYS BEFORE ISSUING A FINAL NEGATIVE REPORT Performed at [facility]    Report Status PENDING  Incomplete  Culture, blood (routine x 2)     Status: None (Preliminary result)   Collection Time: 06/03/14  8:50 PM  Result Value Ref Range Status   Specimen Description BLOOD RIGHT ARM  Final   Special Requests BOTTLES DRAWN AEROBIC ONLY 5CC  Final   Culture   Final           BLOOD CULTURE RECEIVED NO GROWTH TO DATE CULTURE WILL BE HELD FOR 5 DAYS BEFORE ISSUING A FINAL NEGATIVE REPORT Performed at [facility]    Report Status PENDING  Incomplete  Culture, blood (routine x 2)     Status: None (Preliminary result)   Collection Time: 06/05/14 12:32 AM  Result Value Ref Range Status   Specimen Description BLOOD RIGHT HAND  Final   Special Requests BOTTLES DRAWN AEROBIC ONLY 10CC  Final   Culture   Final           BLOOD CULTURE RECEIVED NO GROWTH TO DATE CULTURE WILL BE HELD FOR 5 DAYS BEFORE ISSUING A FINAL NEGATIVE REPORT Performed at [facility]    Report Status PENDING  Incomplete  Culture, blood (routine x 2)     Status: None  (Preliminary result)   Collection Time:  06/05/14 12:39 AM  Result Value Ref Range Status   Specimen Description BLOOD LEFT HAND  Final   Special Requests BOTTLES DRAWN AEROBIC ONLY 10CC  Final   Culture   Final           BLOOD CULTURE RECEIVED NO GROWTH TO DATE CULTURE WILL BE HELD FOR 5 DAYS BEFORE ISSUING A FINAL NEGATIVE REPORT Performed at [facility]    Report Status PENDING  Incomplete    Blood Culture:    Component Value Date/Time   SDES BLOOD LEFT HAND 06/05/2014 0039   SPECREQUEST BOTTLES DRAWN AEROBIC ONLY 10CC 06/05/2014 0039   CULT  06/05/2014 0039           BLOOD CULTURE RECEIVED NO GROWTH TO DATE CULTURE WILL BE HELD FOR 5 DAYS BEFORE ISSUING A FINAL NEGATIVE REPORT Performed at [facility]    REPTSTATUS PENDING 06/05/2014 0039    Studies/Results: No results found.  Medications:  Scheduled Meds: . antiseptic oral rinse  7 mL Mouth Rinse q12n4p  . aspirin EC  81 mg Oral Daily  . atorvastatin  40 mg Oral QHS  . buPROPion  150 mg Oral BID  . chlorhexidine  15 mL Mouth Rinse BID  . cholecalciferol  1,000 Units Oral Daily  . ciprofloxacin  500 mg Oral BID  . Dimethyl Fumarate  1 capsule Oral BID  . docusate sodium  100 mg Oral BID  . heparin  5,000 Units Subcutaneous 3 times per day  . insulin aspart  0-9 Units Subcutaneous TID WC  . multivitamin with minerals  1 tablet Oral Daily  . pantoprazole  40 mg Oral Q0600   Continuous Infusions:   PRN Meds:   Antibiotics: Antibiotics Given (last 72 hours)    Date/Time Action Medication Dose Rate   06/04/14 2000 Given   cefTRIAXone (ROCEPHIN) 1 g in dextrose 5 % 50 mL IVPB - Premix 1 g 100 mL/hr   06/05/14 0243 Given   piperacillin-tazobactam (ZOSYN) IVPB 3.375 g 3.375 g 12.5 mL/hr   06/05/14 0243 Given   vancomycin (VANCOCIN) IVPB 1000 mg/200 mL premix 1,000 mg 200 mL/hr   06/05/14 1610 Given   piperacillin-tazobactam (ZOSYN) IVPB 3.375 g 3.375 g 12.5 mL/hr   06/05/14 9604 Given    vancomycin (VANCOCIN) IVPB 750 mg/150 ml premix 750 mg 150 mL/hr   06/05/14 1350 Given   piperacillin-tazobactam (ZOSYN) IVPB 3.375 g 3.375 g 12.5 mL/hr   06/05/14 2111 Given   cefTRIAXone (ROCEPHIN) 1 g in dextrose 5 % 50 mL IVPB - Premix 1 g 100 mL/hr   06/06/14 1235 Given   ciprofloxacin (CIPRO) tablet 500 mg 500 mg    06/06/14 2212 Given   ciprofloxacin (CIPRO) tablet 500 mg 500 mg    06/07/14 0816 Given   ciprofloxacin (CIPRO) tablet 500 mg 500 mg       Day of Hospitalization:   Consults:    Assessment/Plan:   Principal Problem:   Sepsis due to urinary tract infection (E.coli)  Active Problems:   Diabetes mellitus   Essential hypertension, benign   Dyslipidemia   Multiple sclerosis, relapsing-remitting   Sepsis likely due to UTI (E.coli)  UTI and likely source of generalized weakness and leukocytosis. Urine cx growing e.coli.  Sensitive to cipro.  He is on day 5 of abx and will complete 7 day course.  Lungs are CTAB so PNA unlikely source of sepsis and CXR wnl.  Leukocytosis is downtrending.   -cont cipro today through 4/12 for 7  total days   -Blood cx NGTD  -likely d/c today   ?Sacral wound -will consult wound care and have them look at the wound before d/c   RRMS Pt has not seen a neurologist in a while and has been followed by the [facility] physician. Has not been on prednisone since 2014. He is on tecfidera for MS which he recently restarted. But his sister states this causes slurred speech. He is also on enablex for bladder spasms at home but being held here d/t anticholinergic effects.  Neurology consulted recommends tx underlying infection likely pseudo flair.  PT/OT recommends but pt prefers to go home with [facility].   -cont PT/OT at [facility]  -cont tecfidera -appreciate neuro recs   Dysphagia Pt failed initial swallow study in the ED.  SLP recommended MBS and now on carb mod diet.     -cont carb modified diet   Hypertension  Stable-->normotensive. -cont hold  meds   -will lower dose of diovan upon d/c to  qd   Diabetes Mellitus II  Pt on metformin /day and lantus for home meds.  -resume metformin and lantus on d/c  -SSI-S, ac and hs cbg  GERD  Pt on chronic PPI at home. -continue home meds  Dyslipidemia  Pt is on lipitor  qhs at home.  -continue statin therapy  Substance abuse  Pt has a h/o tobacco use -smoking cessation  -cont bupropion   Fall  Unclear if syncopal episode. Has h/o prior falls. No known h/o seizures. No cardiac history. Denies dizziness, lightheadedness, palpitations, diaphoresis, N/V. Unfortunately orthostatics were not checked prior to IVF.  EKG without acute changes, trop neg, UDS neg.   -cont PT/OT as outpatient   FEN Fluids-None  Electrolytes-replete PRN  Nutrition- Carb Mod   Disposition Anticipated discharge today with continued rehab at [facility].  Hospital bed ordered.    LOS: 3 days   Marrian Salvage, MD PGY-2, Internal Medicine Teaching Service 06/07/2014, 12:04 PM

## 2014-06-07 NOTE — Progress Notes (Signed)
Subjective: Mario Shields.   Per nursing, there was poor urine output overnight, but then realized that his condom catheter was off. The patient states that he is thirsty, but has been drinking well. He ate some of his dinner last night, but doesn't like the food here. He denies fevers, SOB, cough, or chest pain. He says that he would like to go home.   With regards to his family's concerns regarding depression, he says he was depressed after his mother died, but thinks he is getting better now. He declines psychiatric referral. He is unsure of who put him on bupropion. He is amenable to resources for smoking cessation. He smokes about 4 cigarettes a day and is interested in the nicotine patch.   Objective: Vital signs in last 24 hours: Filed Vitals:   06/06/14 1518 06/06/14 2004 06/07/14 0024 06/07/14 0416  BP: 125/77 113/75 113/70 121/73  Pulse: 83 84 84 74  Temp: 99.7 F (37.6 C) 99.5 F (37.5 C) 98.1 F (36.7 C) 98.4 F (36.9 C)  TempSrc: Oral Oral Oral Oral  Resp: 20 18 18 18   Height:      Weight:      SpO2: 97% 93% 96% 100%   Weight change:   Intake/Output Summary (Last 24 hours) at 06/07/14 0927 Last data filed at 06/06/14 1957  Gross per 24 hour  Intake      0 ml  Output    820 ml  Net   -820 ml   General: Lying in bed in no acute distress.  CV: RRR, no murmurs Pulm: CTAB anteriorly. Normal work of breathing.  Abdomen: Positive for bowel sounds. Somewhat distended. Non-tender.   Lab Results: Basic Metabolic Panel:  Recent Labs  30/86/57 0502 06/06/14 2010  NA 138  --   K 3.4*  --   CL 107  --   CO2 22  --   GLUCOSE 121*  --   BUN 6  --   CREATININE 0.76  --   CALCIUM 8.9  --   MG  --  2.3   CBC:  Recent Labs  06/05/14 0447 06/06/14 0502 06/07/14 0753  WBC 23.4* 16.7* 11.1*  NEUTROABS 16.9* 10.9*  --   HGB 13.4 12.4* 12.7*  HCT 38.7* 36.2* 37.6*  MCV 86.4 86.8 86.8  PLT 181 180 192  CBG:  Recent Labs  06/05/14 2156 06/06/14 0803 06/06/14 1217  06/06/14 1721 06/06/14 2321 06/07/14 0802  GLUCAP 168* 114* 158* 130* 171* 126*   Micro Results: Reviewed in EPIC. Please see chart for full details.   Medications: Scheduled Meds: . antiseptic oral rinse  7 mL Mouth Rinse q12n4p  . aspirin EC  81 mg Oral Daily  . atorvastatin  40 mg Oral QHS  . buPROPion  150 mg Oral BID  . chlorhexidine  15 mL Mouth Rinse BID  . cholecalciferol  1,000 Units Oral Daily  . ciprofloxacin  500 mg Oral BID  . Dimethyl Fumarate  1 capsule Oral BID  . docusate sodium  100 mg Oral BID  . heparin  5,000 Units Subcutaneous 3 times per day  . insulin aspart  0-9 Units Subcutaneous TID WC  . multivitamin with minerals  1 tablet Oral Daily  . pantoprazole  40 mg Oral Q0600   Assessment/Plan:Mario Shields is a 54 yo male with a PMH relevant for multiple sclerosis, diabetes, hypertension, and hyperlipidemia who presents to [facility] for further evaluation management of slurred speech, weakness, lethargy, and E. coli UTI.  Principal Problem:   Sepsis due to urinary tract infection (E.coli)  Active Problems:   Diabetes mellitus   Essential hypertension, benign   Dyslipidemia   Multiple sclerosis, relapsing-remitting  E. coli UTI: Asymptomatic, but did present with tachycardia, altered mental status, and possible orthostasis leading to a fall. Now s/p 3 days of ceftriaxone with urine culture growing E. coli. Did receive 2 days of vanc/zosyn started for fever of 103 noted overnight on 4/7. -- Continued improvement in leukocytosis and afebrile overnight after transition to oral ciprofloxacin -- Cultures with E. coli sensitive to ciprofloxacin (see full report in EPIC for details) -- Blood cultures from 4/6 and 4/8 pending  Multiple sclerosis, now with weakness, slurred Speech, AMS: History of sub-acute slurred speech, AMS, and progressive dysarthria on top of chronic changes including decreased po intake. His MRI is concerning for progression of his MS, but acute  flare is unlikely given MRI and per neurology recommendations. SLP has evaluated dysphagia with barium swallow, and recommends normal diet but continued therapy. Per family's concerns, depression and decreased motivation may be playing a role in his chronic weakness and deconditioning. Patient declines psychiatric follow-up, stating that his depression has improved.  -- Neurology consult; appreciate reccs -- Continue home tecfidera -- Discussed with case manager for PT/OT/SLP as an outpatient  Fall: Most likely mechanical in the setting of weakness and UTI.  -- Treating UTI as above.   Chronic Disease Mangement: Hypertension, hyperlipidemia, diabetes mellitus type 2. No recent A1c or cholesterol panel. Follows with [facility] for primary care needs.  -- Hold home valsartan-HCTZ -- Holding home metformin; on SSI -- Continue home aspirin, atorvastatin, bupropion   FEN/GI:  -- Home cholecalciferol  -- Potassium is 4.2 s/p repletion  -- Constipation: on colace and miralax; may benefit from continued PRN treatment after discharge   PPx:  -- DVT ppx: heparin TID -- GI ppx: home pantoprazole   Dispo:  -- Discussed need to follow-up with his neurologist and PCP at [facility] after discharge. Will include information on setting up these appointments in the discharge summary -- Care management consult for assistance in coordinating outpatient home health, PT/OT/SLP with [facility]  This is a Medical Student Note.  The care of the patient was discussed with Dr. Delane Ginger and the assessment and plan formulated with their assistance.  Please see their attached note for official documentation of the daily encounter.   LOS: 3 days   Mario Shields, Med Student 06/07/2014, 9:27 AM [phone]

## 2014-06-07 NOTE — Progress Notes (Signed)
Patient discharge teaching given, including activity, diet, follow-up appoints, and medications. Patient verbalized understanding of all discharge instructions. Pt had no IV access since yesterday. Vitals are stable. Skin is intact except as charted in most recent assessments. Pt to be escorted out by RN, to be driven home by family.

## 2014-06-10 LAB — CULTURE, BLOOD (ROUTINE X 2)
Culture: NO GROWTH
Culture: NO GROWTH

## 2014-06-11 LAB — CULTURE, BLOOD (ROUTINE X 2)
Culture: NO GROWTH
Culture: NO GROWTH

## 2014-06-11 NOTE — Discharge Summary (Signed)
Name: Mario Shields MRN: [id number] DOB: 1960-05-17 54 y.o. PCP: Jarome Matin, MD ______________________________________________________________  Date of Admission: 06/03/2014 10:13 AM Date of Discharge: 06/07/2014 Attending Physician: Earl Lagos, MD   Discharge Diagnosis: Principal Problem:   Sepsis due to urinary tract infection (E.coli)  Active Problems:   Diabetes mellitus   Essential hypertension, benign   Dyslipidemia   Multiple sclerosis, relapsing-remitting   Discharge Medications:   Medication List    STOP taking these medications        ENABLEX 7.5 MG 24 hr tablet  Generic drug:  darifenacin     valsartan-hydrochlorothiazide 320-12.5 MG per tablet  Commonly known as:  DIOVAN-HCT      TAKE these medications        aspirin EC 81 MG tablet  Take 81 mg by mouth daily.     atorvastatin 40 MG tablet  Commonly known as:  LIPITOR  Take 40 mg by mouth at bedtime.     BAYER CONTOUR TEST test strip  Generic drug:  glucose blood  1 each by Other route as needed.     buPROPion 150 MG 12 hr tablet  Commonly known as:  WELLBUTRIN SR  Take 150 mg by mouth 2 (two) times daily.     cholecalciferol 1000 UNITS tablet  Commonly known as:  VITAMIN D  Take 1,000 Units by mouth daily.     Dimethyl Fumarate 240 MG Cpdr  Commonly known as:  TECFIDERA  Take 1 capsule (240 mg total) by mouth 2 (two) times daily.     docusate sodium 100 MG capsule  Commonly known as:  COLACE  Take 1 capsule (100 mg total) by mouth 2 (two) times daily.     insulin glargine 100 UNIT/ML injection  Commonly known as:  LANTUS  Inject 30 Units into the skin daily.     metFORMIN 500 MG 24 hr tablet  Commonly known as:  GLUCOPHAGE-XR  Take 1,000 mg by mouth daily with breakfast.     multivitamin with minerals Tabs tablet  Take 1 tablet by mouth daily.     pantoprazole 40 MG tablet  Commonly known as:  PROTONIX  Take 1 tablet (40 mg total) by mouth daily at 6 (six) AM.     valsartan 80 MG tablet  Commonly known as:  DIOVAN  Take 1 tablet (80 mg total) by mouth daily.        Disposition and follow-up:   Mario Shields was discharged from [facility] in good condition to home.  Please address the following problems post-discharge:  1.Resolution of falls 2.Completion of course of cipro  Labs / imaging needed at time of follow-up: None  Pending labs/ test needing follow-up: None  Follow-up Appointments: Follow-up Information    Follow up with PENUMALLI,VIKRAM, MD. Schedule an appointment as soon as possible for a visit in 2 weeks.   Specialties:  Neurology, Radiology   Contact information:   [address] [place] [address] [phone]       [facility]   Why:  [facility] will call you with an appointment   Contact information:   [phone]      Follow up with [facility]. Schedule an appointment as soon as possible for a visit in 1 day.      Follow up with [facility].   Why:  home health RN to do skin checks and follow wound  Contact information:   [address] [place] [address] [phone]       Discharge Instructions: Discharge Instructions    Call MD for:  persistant nausea and vomiting    Complete by:  As directed      Call MD for:  temperature >100.4    Complete by:  As directed      Diet - low sodium heart healthy    Complete by:  As directed      Increase activity slowly    Complete by:  As directed            Consultations:    Procedures Performed:  Dg Chest 2 View  06/05/2014   CLINICAL DATA:  Fever. Multiple sclerosis. New slurred speech. Altered mental status. Sepsis. Found to have urinary tract infection but still spiking fevers on treatment. Concern for pneumonia.  EXAM: CHEST  2 VIEW  COMPARISON:  04/15/2012  FINDINGS: The heart size and mediastinal contours are within normal limits.  Both lungs are clear. The visualized skeletal structures are unremarkable.  IMPRESSION: No active cardiopulmonary disease.   Electronically Signed   By: Burman Nieves M.D.   On: 06/05/2014 00:44   Mr Laqueta Jean BJ Contrast  06/03/2014   CLINICAL DATA:  54 year old male with multiple sclerosis. Found down, fall from wheelchair. Right side weakness and slurred speech. Initial encounter.  EXAM: MRI HEAD WITHOUT AND WITH CONTRAST  TECHNIQUE: Multiplanar, multiecho pulse sequences of the brain and surrounding structures were obtained without and with intravenous contrast.  CONTRAST:  15 mL MultiHance.  COMPARISON:  Brain MRI 04/15/2012 and earlier.  FINDINGS: Mild generalized cerebral volume loss since 2014.  New since 2014 is moderate to severe T2 signal abnormality involving the left caudate nucleus and right paracentral pons. These more resembles sequelae of small vessel ischemia then demyelinating disease, and there may be associated hemosiderin deposition at both sites.  However, there has been significant progression of abnormal T2 and FLAIR hyperintensity within the bilateral cerebellar peduncle is a which is typical of progressed demyelinating disease. There is also progressed indistinct T2 hyperintensity in the left paracentral pons. There is then significantly progressed bilateral cerebellar hemispheric T2 hyperintensity which could relate to either chronic lacunar infarcts or demyelinating disease. This is more severe on the left (series 4, image 7).  Advanced chronic supratentorial white matter T2 and FLAIR hyperintensity then has not significantly changed since 2014.  No restricted diffusion or evidence of acute infarction. Major intracranial vascular flow voids are stable. No abnormal enhancement identified.  No midline shift, mass effect, evidence of mass lesion, ventriculomegaly, extra-axial collection or acute intracranial hemorrhage. Cervicomedullary junction and pituitary are within normal limits.  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 Grossly stable and negative visualized cervical spinal cord. Normal bone marrow signal.  Visible internal auditory structures appear normal. Mastoids are clear. Trace paranasal sinus mucosal thickening. Bilateral orbits soft tissues appear stable. Visualized scalp soft tissues are within normal limits.  IMPRESSION: 1. Progressed signal abnormality in the brain since 2014 which suggest progression of both chronic demyelinating disease, and interval small vessel ischemia. 2. No acute intracranial abnormality or active demyelinating disease identified.   Electronically Signed   By: Odessa Fleming M.D.   On: 06/03/2014 15:06   Dg Swallowing Func-speech Pathology  06/04/2014    Objective Swallowing Evaluation:   Modified Barium Swallow  Patient Details  Name: Mario Shields MRN: [id number] Date of Birth: 07-Aug-1960  Today's Date: 06/04/2014 Time: SLP Start  Time (ACUTE ONLY): 1113-SLP Stop Time (ACUTE ONLY): 1137 SLP Time Calculation (min) (ACUTE ONLY): 24 min  Past Medical History:  Past Medical History  Diagnosis Date  . MS (multiple sclerosis)   . Hypertension   . Hyperlipidemia   . Anemia   . Type II diabetes mellitus   . GERD (gastroesophageal reflux disease)   . Depression    Past Surgical History:  Past Surgical History  Procedure Laterality Date  . Appendectomy     HPI:  HPI: 54 yo man with a PMH significant for multiple sclerosis (initial dx  1990), hypertension, hyperlipidemia, and diabetes mellitus who presents  for further evaluation of slurred speech and a fall.  Family states on  06-02-14  he slid out of bed, and his brother found him when he got home  from work. MS has had progressive decline since February. Currently unable  to ambulate and requires assistance. Family has also noted decreased PO  intake and difficulty swallowing over the past few months. MRI on 06-03-14  negative for acute intracranial abnormality, with progressed chronic  demyelinating disease  and interrnal small vessel ischemia  No Data Recorded  Assessment / Plan / Recommendation CHL IP CLINICAL IMPRESSIONS 06/04/2014  Dysphagia Diagnosis Mild oral phase dysphagia;Mild pharyngeal phase  dysphagia  Clinical impression Pt presents with sensory based mild oral pharyngeal  dysphagia. No penetration or aspiration noted with thin or regular  consistencies trialed. Pt with delayed swallow initiation allowing thin  liquid bolus consistencies traveling to the level of the pyriform sinuses  with larger cup sips. However good airway protection evidenced throughout  the study. Pt with inconsistent bolus control due to oral phase  impairments. Recommend regular thin diet. Meds whole with thin liquids.  Continued ST interventon indicated for diet tolerance along with  education/training of safe swallow strategies as patient exhibits  impulsivity with PO.        CHL IP TREATMENT RECOMMENDATION 06/04/2014  Treatment Plan Recommendations Therapy as outlined in treatment plan below      CHL IP DIET RECOMMENDATION 06/04/2014  Diet Recommendations Regular;Thin liquid  Liquid Administration via Straw;Cup  Medication Administration Whole meds with liquid  Compensations Slow rate  Postural Changes and/or Swallow Maneuvers Seated upright 90 degrees     CHL IP OTHER RECOMMENDATIONS 06/04/2014  Recommended Consults (None)  Oral Care Recommendations Oral care BID  Other Recommendations (None)     No flowsheet data found.   CHL IP FREQUENCY AND DURATION 06/04/2014  Speech Therapy Frequency (ACUTE ONLY) min 1 x/week  Treatment Duration 1 week     Pertinent Vitals/Pain none    SLP Swallow Goals No flowsheet data found.  No flowsheet data found.    CHL IP REASON FOR REFERRAL 06/04/2014  Reason for Referral Objectively evaluate swallowing function     CHL IP ORAL PHASE 06/04/2014  Lips (None)  Tongue (None)  Mucous membranes (None)  Nutritional status (None)  Other (None)  Oxygen therapy (None)  Oral Phase (None)  Oral - Pudding Teaspoon (None)   Oral - Pudding Cup (None)  Oral - Honey Teaspoon (None)  Oral - Honey Cup (None)  Oral - Honey Syringe (None)  Oral - Nectar Teaspoon (None)  Oral - Nectar Cup (None)  Oral - Nectar Straw (None)  Oral - Nectar Syringe (None)  Oral - Ice Chips (None)  Oral - Thin Teaspoon Holding of bolus  Oral - Thin Cup Holding of bolus;Delayed oral transit  Oral - Thin Straw (None)  Oral -  Thin Syringe (None)  Oral - Puree (None)  Oral - Mechanical Soft (None)  Oral - Regular Piecemeal swallowing  Oral - Multi-consistency (None)  Oral - Pill (No Data)  Oral Phase - Comment (None)      CHL IP PHARYNGEAL PHASE 06/04/2014  Pharyngeal Phase Impaired  Pharyngeal - Pudding Teaspoon (None)  Penetration/Aspiration details (pudding teaspoon) (None)  Pharyngeal - Pudding Cup (None)  Penetration/Aspiration details (pudding cup) (None)  Pharyngeal - Honey Teaspoon (None)  Penetration/Aspiration details (honey teaspoon) (None)  Pharyngeal - Honey Cup (None)  Penetration/Aspiration details (honey cup) (None)  Pharyngeal - Honey Syringe (None)  Penetration/Aspiration details (honey syringe) (None)  Pharyngeal - Nectar Teaspoon (None)  Penetration/Aspiration details (nectar teaspoon) (None)  Pharyngeal - Nectar Cup (None)  Penetration/Aspiration details (nectar cup) (None)  Pharyngeal - Nectar Straw (None)  Penetration/Aspiration details (nectar straw) (None)  Pharyngeal - Nectar Syringe (None)  Penetration/Aspiration details (nectar syringe) (None)  Pharyngeal - Ice Chips (None)  Penetration/Aspiration details (ice chips) (None)  Pharyngeal - Thin Teaspoon Premature spillage to valleculae  Penetration/Aspiration details (thin teaspoon) (None)  Pharyngeal - Thin Cup Premature spillage to pyriform sinuses;Delayed  swallow initiation  Penetration/Aspiration details (thin cup) (None)  Pharyngeal - Thin Straw Delayed swallow initiation;Premature spillage to  pyriform sinuses  Penetration/Aspiration details (thin straw) (None)  Pharyngeal - Thin Syringe  (None)  Penetration/Aspiration details (thin syringe') (None)  Pharyngeal - Puree (None)  Penetration/Aspiration details (puree) (None)  Pharyngeal - Mechanical Soft (None)  Penetration/Aspiration details (mechanical soft) (None)  Pharyngeal - Regular Premature spillage to valleculae  Penetration/Aspiration details (regular) (None)  Pharyngeal - Multi-consistency (None)  Penetration/Aspiration details (multi-consistency) (None)  Pharyngeal - Pill (None)  Penetration/Aspiration details (pill) (None)  Pharyngeal Comment (No Data)     CHL IP CERVICAL ESOPHAGEAL PHASE 06/04/2014  Cervical Esophageal Phase WFL  Pudding Teaspoon (None)  Pudding Cup (None)  Honey Teaspoon (None)  Honey Cup (None)  Honey Syringe (None)  Nectar Teaspoon (None)  Nectar Cup (None)  Nectar Straw (None)  Nectar Syringe (None)  Thin Teaspoon (None)  Thin Cup (None)  Thin Straw (None)  Thin Syringe (None)  Cervical Esophageal Comment (None)    CHL IP GO 06/04/2014  Functional Assessment Tool Used (None)  Functional Limitations Swallowing  Swallow Current Status (W0981) (None)  Swallow Goal Status (X9147) (None)  Swallow Discharge Status (W2956) (None)  Motor Speech Current Status (O1308) (None)  Motor Speech Goal Status (M5784) (None)  Motor Speech Goal Status (O9629) (None)  Spoken Language Comprehension Current Status (B2841) (None)  Spoken Language Comprehension Goal Status (L2440) (None)  Spoken Language Comprehension Discharge Status (N0272) (None)  Spoken Language Expression Current Status (Z3664) (None)  Spoken Language Expression Goal Status (Q0347) (None)  Spoken Language Expression Discharge Status [phone]) (None)  Attention Current Status (G3875) (None)  Attention Goal Status (I4332) (None)  Attention Discharge Status (R5188) (None)  Memory Current Status (C1660) (None)  Memory Goal Status (Y3016) (None)  Memory Discharge Status (W1093) (None)  Voice Current Status (A3557) (None)  Voice Goal Status (D2202) (None)  Voice Discharge Status  (R4270) (None)  Other Speech-Language Pathology Functional Limitation [phone]) (None)  Other Speech-Language Pathology Functional Limitation Goal Status (E8315)  (None)  Other Speech-Language Pathology Functional Limitation Discharge Status  [phone]) (None)           Royce Macadamia 06/04/2014, 12:24 PM   Breck Coons Lonell Face.Ed CCC-SLP Pager [phone]       2D Echo: N/A  Cardiac Cath: N/A  Admission HPI:  Mario Shields is a 54 y.o. male who has a past medical history of MS (multiple sclerosis); Hypertension; Hyperlipidemia; Anemia; Type II diabetes mellitus; GERD (gastroesophageal reflux disease); and Depression.Marland Kitchen Pt presents to the ED with slurred speech, generalized weakness, and a fall. The history is primarily obtained from the patient's sister. Briefly, Mr. Shellhammer is a [facility] and has a h/o MS. He was previously followed by a neurologist for his MS but now is managed by the [facility] physician. He apparently lives with his brother who is not present but his sister states he recently began a medication for his MS that she states has made the slurred speech worse. He is primarily non-ambulatory although the sister states he does not really try. He had a fall out of bed last evening without any trauma which is what brought him to the ED along with worsening slurred speech. He goes to [facility] a couple of times per week and the other times he is cared for by his brother. She denies any other symptoms--no chest pain, SOB, fever/chills, abdominal pain, recent URI symptoms, or urinary symptoms. She reports he has frequent constipation sometimes going a month without a BM. He hs not been on prednisone since 2014. She states the [facility] physician did not want to start him on prednisone because it would make his blood sugars go up. She says he has been in PT but has been non-cooperative. Sister reports poor po intake and frequent difficulty swallowing with frequent gagging.   MRI completed while in  the ED showed progressed signal abnormality in the brain since 2014 which suggest progression of both chronic demyelinating disease and interval small vessel ischemia. No acute abnormality or active demyelinating disease identified. He has a leukocytosis and UA suggestive of UTI.   Original H&P by Marrian Salvage, MD  Hospital Course by problem list: Principal Problem:   Sepsis due to urinary tract infection (E.coli)  Active Problems:   Diabetes mellitus   Essential hypertension, benign   Dyslipidemia   Multiple sclerosis, relapsing-remitting   Sepsis likely due to UTI UA suggests UTI and likely source of generalized weakness and leukocytosis. Lungs were CTAB so PNA unlikely source of sepsis and CXR without active cardiopulmonary disease.  He was put on ceftriaxone with blood cultures without growth and urine culture showing E.coli.  He was transitioned to ciprofloxacin and will complete a 7 day course as an outpatient.  We also stopped any anticholinergics (enablex) that may be contributing to urinary retention.    RRMS Pt has not seen a neurologist in a while and has been followed by the [facility] physician. Has not been on prednisone since 2014. He is on tecfidera for MS which he recently restarted. But his sister states this causes slurred speech. He is also on enablex for bladder spasms at home but being held here d/t anticholinergic effects. Neurology consulted recommends tx underlying infection likely pseudo flair.  Physical therapy recommends SNF but patient goes to [facility] and wishes to continue with [facility] and family is in agreement.    Dysphagia Pt failed initial RN bedside swallow study in the ED.  However, upon evaluation by SLP they recommend a normal diet.     Hypertension  Stable.  We held his home meds as his blood pressure remained within normal limits.  However, upon discharge we resumed his home diovan but at a lower dose.     Diabetes Mellitus II  Pt on metformin  1000mg /day and lantus for home meds.  We held his home metformin and was started on sliding scale insulin and lantus.  We resumed home doses upon discharge.   GERD  Pt on chronic PPI at home.  Continued.    Dyslipidemia  Pt is on lipitor 40mg  qhs at home. Continued.   Substance abuse  Pt has a h/o tobacco use.  Encouraged cessation.   Fall  Unclear if syncopal episode. Has h/o prior falls. No known h/o seizures. No cardiac history. Denies dizziness, lightheadedness, palpitations, diaphoresis, N/V. Unfortunately orthostatics were not checked prior to IVF. EKG without acute changes, trop neg, UDS neg.  Likely d/t recent UTI which was treated per above.    Discharge Vitals:   BP 119/83 mmHg  Pulse 80  Temp(Src) 98.7 F (37.1 C) (Oral)  Resp 16  Ht 5\' 8"  (1.727 m)  Wt 167 lb (75.751 kg)  BMI 25.40 kg/m2  SpO2 99%  Discharge Labs:  No results found for this or any previous visit (from the past 24 hour(s)).  Signed: Marrian Salvage, MD 06/17/2014, 1:54 PM   Services Ordered on Discharge: None  Equipment Ordered on Discharge: None

## 2014-09-12 ENCOUNTER — Encounter (HOSPITAL_COMMUNITY): Payer: Self-pay | Admitting: *Deleted

## 2014-09-12 ENCOUNTER — Inpatient Hospital Stay (HOSPITAL_COMMUNITY): Payer: Medicare (Managed Care)

## 2014-09-12 ENCOUNTER — Inpatient Hospital Stay (HOSPITAL_COMMUNITY)
Admission: EM | Admit: 2014-09-12 | Discharge: 2014-09-28 | DRG: 208 | Disposition: E | Payer: Medicare (Managed Care) | Attending: Pulmonary Disease | Admitting: Pulmonary Disease

## 2014-09-12 ENCOUNTER — Emergency Department (HOSPITAL_COMMUNITY): Payer: Medicare (Managed Care)

## 2014-09-12 DIAGNOSIS — K219 Gastro-esophageal reflux disease without esophagitis: Secondary | ICD-10-CM | POA: Diagnosis present

## 2014-09-12 DIAGNOSIS — E876 Hypokalemia: Secondary | ICD-10-CM | POA: Diagnosis present

## 2014-09-12 DIAGNOSIS — R55 Syncope and collapse: Secondary | ICD-10-CM | POA: Diagnosis present

## 2014-09-12 DIAGNOSIS — E872 Acidosis, unspecified: Secondary | ICD-10-CM

## 2014-09-12 DIAGNOSIS — F329 Major depressive disorder, single episode, unspecified: Secondary | ICD-10-CM | POA: Diagnosis present

## 2014-09-12 DIAGNOSIS — R579 Shock, unspecified: Secondary | ICD-10-CM | POA: Diagnosis not present

## 2014-09-12 DIAGNOSIS — I10 Essential (primary) hypertension: Secondary | ICD-10-CM | POA: Diagnosis present

## 2014-09-12 DIAGNOSIS — M549 Dorsalgia, unspecified: Secondary | ICD-10-CM | POA: Diagnosis present

## 2014-09-12 DIAGNOSIS — I959 Hypotension, unspecified: Secondary | ICD-10-CM | POA: Diagnosis not present

## 2014-09-12 DIAGNOSIS — I248 Other forms of acute ischemic heart disease: Secondary | ICD-10-CM | POA: Diagnosis present

## 2014-09-12 DIAGNOSIS — I2699 Other pulmonary embolism without acute cor pulmonale: Secondary | ICD-10-CM | POA: Diagnosis present

## 2014-09-12 DIAGNOSIS — R1310 Dysphagia, unspecified: Secondary | ICD-10-CM | POA: Diagnosis present

## 2014-09-12 DIAGNOSIS — D649 Anemia, unspecified: Secondary | ICD-10-CM | POA: Diagnosis present

## 2014-09-12 DIAGNOSIS — K5900 Constipation, unspecified: Secondary | ICD-10-CM | POA: Diagnosis present

## 2014-09-12 DIAGNOSIS — R001 Bradycardia, unspecified: Secondary | ICD-10-CM | POA: Diagnosis present

## 2014-09-12 DIAGNOSIS — E1165 Type 2 diabetes mellitus with hyperglycemia: Secondary | ICD-10-CM | POA: Diagnosis present

## 2014-09-12 DIAGNOSIS — R000 Tachycardia, unspecified: Secondary | ICD-10-CM | POA: Diagnosis present

## 2014-09-12 DIAGNOSIS — R7989 Other specified abnormal findings of blood chemistry: Secondary | ICD-10-CM

## 2014-09-12 DIAGNOSIS — I468 Cardiac arrest due to other underlying condition: Secondary | ICD-10-CM | POA: Diagnosis present

## 2014-09-12 DIAGNOSIS — I4510 Unspecified right bundle-branch block: Secondary | ICD-10-CM | POA: Diagnosis present

## 2014-09-12 DIAGNOSIS — Z794 Long term (current) use of insulin: Secondary | ICD-10-CM

## 2014-09-12 DIAGNOSIS — I82432 Acute embolism and thrombosis of left popliteal vein: Secondary | ICD-10-CM | POA: Diagnosis present

## 2014-09-12 DIAGNOSIS — F17210 Nicotine dependence, cigarettes, uncomplicated: Secondary | ICD-10-CM | POA: Diagnosis present

## 2014-09-12 DIAGNOSIS — I469 Cardiac arrest, cause unspecified: Secondary | ICD-10-CM

## 2014-09-12 DIAGNOSIS — G35 Multiple sclerosis: Secondary | ICD-10-CM | POA: Diagnosis present

## 2014-09-12 DIAGNOSIS — R9431 Abnormal electrocardiogram [ECG] [EKG]: Secondary | ICD-10-CM | POA: Diagnosis not present

## 2014-09-12 DIAGNOSIS — J9600 Acute respiratory failure, unspecified whether with hypoxia or hypercapnia: Secondary | ICD-10-CM

## 2014-09-12 DIAGNOSIS — D72829 Elevated white blood cell count, unspecified: Secondary | ICD-10-CM | POA: Diagnosis present

## 2014-09-12 DIAGNOSIS — I517 Cardiomegaly: Secondary | ICD-10-CM | POA: Diagnosis present

## 2014-09-12 DIAGNOSIS — R4781 Slurred speech: Secondary | ICD-10-CM | POA: Diagnosis present

## 2014-09-12 DIAGNOSIS — Z7982 Long term (current) use of aspirin: Secondary | ICD-10-CM

## 2014-09-12 DIAGNOSIS — R0602 Shortness of breath: Secondary | ICD-10-CM | POA: Diagnosis not present

## 2014-09-12 DIAGNOSIS — G9341 Metabolic encephalopathy: Secondary | ICD-10-CM | POA: Diagnosis present

## 2014-09-12 DIAGNOSIS — E785 Hyperlipidemia, unspecified: Secondary | ICD-10-CM | POA: Diagnosis present

## 2014-09-12 DIAGNOSIS — R4182 Altered mental status, unspecified: Secondary | ICD-10-CM

## 2014-09-12 DIAGNOSIS — R112 Nausea with vomiting, unspecified: Secondary | ICD-10-CM

## 2014-09-12 DIAGNOSIS — R931 Abnormal findings on diagnostic imaging of heart and coronary circulation: Secondary | ICD-10-CM

## 2014-09-12 DIAGNOSIS — Z79899 Other long term (current) drug therapy: Secondary | ICD-10-CM | POA: Diagnosis not present

## 2014-09-12 DIAGNOSIS — R778 Other specified abnormalities of plasma proteins: Secondary | ICD-10-CM

## 2014-09-12 HISTORY — DX: Dysphagia, unspecified: R13.10

## 2014-09-12 HISTORY — DX: Slurred speech: R47.81

## 2014-09-12 HISTORY — DX: Weakness: R53.1

## 2014-09-12 LAB — I-STAT TROPONIN, ED: Troponin i, poc: 0.09 ng/mL (ref 0.00–0.08)

## 2014-09-12 LAB — COMPREHENSIVE METABOLIC PANEL
ALK PHOS: 108 U/L (ref 38–126)
ALT: 35 U/L (ref 17–63)
AST: 28 U/L (ref 15–41)
Albumin: 3.7 g/dL (ref 3.5–5.0)
Anion gap: 23 — ABNORMAL HIGH (ref 5–15)
BUN: 12 mg/dL (ref 6–20)
CO2: 14 mmol/L — AB (ref 22–32)
Calcium: 10.2 mg/dL (ref 8.9–10.3)
Chloride: 102 mmol/L (ref 101–111)
Creatinine, Ser: 1.2 mg/dL (ref 0.61–1.24)
GFR calc Af Amer: >60 mL/min (ref >60)
GFR calc non Af Amer: >60 mL/min (ref >60)
GLUCOSE: 341 mg/dL — AB (ref 65–99)
Potassium: 3.2 mmol/L — ABNORMAL LOW (ref 3.5–5.1)
Sodium: 139 mmol/L (ref 135–145)
Total Bilirubin: 0.3 mg/dL (ref 0.3–1.2)
Total Protein: 6.4 g/dL — ABNORMAL LOW (ref 6.5–8.1)

## 2014-09-12 LAB — CBC
HCT: 48.3 % (ref 39.0–52.0)
Hemoglobin: 15.6 g/dL (ref 13.0–17.0)
MCH: 30.2 pg (ref 26.0–34.0)
MCHC: 32.3 g/dL (ref 30.0–36.0)
MCV: 93.6 fL (ref 78.0–100.0)
Platelets: 169 10*3/uL (ref 150–400)
RBC: 5.16 MIL/uL (ref 4.22–5.81)
RDW: 13.7 % (ref 11.5–15.5)
WBC: 25 10*3/uL — ABNORMAL HIGH (ref 4.0–10.5)

## 2014-09-12 LAB — I-STAT ARTERIAL BLOOD GAS, ED
ACID-BASE DEFICIT: 15 mmol/L — AB (ref 0.0–2.0)
BICARBONATE: 13.1 meq/L — AB (ref 20.0–24.0)
O2 Saturation: 100 %
Patient temperature: 98.6
TCO2: 14 mmol/L (ref 0–100)
pCO2 arterial: 36.5 mmHg (ref 35.0–45.0)
pH, Arterial: 7.164 — CL (ref 7.350–7.450)
pO2, Arterial: 240 mmHg — ABNORMAL HIGH (ref 80.0–100.0)

## 2014-09-12 LAB — POCT I-STAT 3, ART BLOOD GAS (G3+)
Acid-base deficit: 7 mmol/L — ABNORMAL HIGH (ref 0.0–2.0)
Bicarbonate: 18 mEq/L — ABNORMAL LOW (ref 20.0–24.0)
O2 Saturation: 99 %
PCO2 ART: 33.4 mmHg — AB (ref 35.0–45.0)
Patient temperature: 98.6
TCO2: 19 mmol/L (ref 0–100)
pH, Arterial: 7.341 — ABNORMAL LOW (ref 7.350–7.450)
pO2, Arterial: 163 mmHg — ABNORMAL HIGH (ref 80.0–100.0)

## 2014-09-12 LAB — LACTIC ACID, PLASMA: LACTIC ACID, VENOUS: 10.8 mmol/L — AB (ref 0.5–2.0)

## 2014-09-12 LAB — BASIC METABOLIC PANEL
Anion gap: 15 (ref 5–15)
BUN: 13 mg/dL (ref 6–20)
CO2: 17 mmol/L — ABNORMAL LOW (ref 22–32)
CREATININE: 1.46 mg/dL — AB (ref 0.61–1.24)
Calcium: 8.8 mg/dL — ABNORMAL LOW (ref 8.9–10.3)
Chloride: 107 mmol/L (ref 101–111)
GFR, EST NON AFRICAN AMERICAN: 52 mL/min — AB (ref >60)
Glucose, Bld: 300 mg/dL — ABNORMAL HIGH (ref 65–99)
Potassium: 5.3 mmol/L — ABNORMAL HIGH (ref 3.5–5.1)
Sodium: 139 mmol/L (ref 135–145)

## 2014-09-12 LAB — I-STAT CHEM 8, ED
BUN: 13 mg/dL (ref 6–20)
Calcium, Ion: 1.21 mmol/L (ref 1.12–1.23)
Chloride: 104 mmol/L (ref 101–111)
Creatinine, Ser: 0.9 mg/dL (ref 0.61–1.24)
Glucose, Bld: 333 mg/dL — ABNORMAL HIGH (ref 65–99)
HEMATOCRIT: 51 % (ref 39.0–52.0)
HEMOGLOBIN: 17.3 g/dL — AB (ref 13.0–17.0)
Potassium: 3 mmol/L — ABNORMAL LOW (ref 3.5–5.1)
SODIUM: 139 mmol/L (ref 135–145)
TCO2: 15 mmol/L (ref 0–100)

## 2014-09-12 LAB — MAGNESIUM
Magnesium: 2.7 mg/dL — ABNORMAL HIGH (ref 1.7–2.4)
Magnesium: 2.5 mg/dL — ABNORMAL HIGH (ref 1.7–2.4)

## 2014-09-12 LAB — URINALYSIS, ROUTINE W REFLEX MICROSCOPIC
Bilirubin Urine: NEGATIVE
GLUCOSE, UA: 250 mg/dL — AB
KETONES UR: NEGATIVE mg/dL
Leukocytes, UA: NEGATIVE
Nitrite: NEGATIVE
PROTEIN: 100 mg/dL — AB
Specific Gravity, Urine: 1.017 (ref 1.005–1.030)
UROBILINOGEN UA: 1 mg/dL (ref 0.0–1.0)
pH: 6 (ref 5.0–8.0)

## 2014-09-12 LAB — MRSA PCR SCREENING: MRSA by PCR: NEGATIVE

## 2014-09-12 LAB — PROTIME-INR
INR: 1.35 (ref 0.00–1.49)
Prothrombin Time: 16.8 seconds — ABNORMAL HIGH (ref 11.6–15.2)

## 2014-09-12 LAB — URINE MICROSCOPIC-ADD ON

## 2014-09-12 LAB — I-STAT CG4 LACTIC ACID, ED: LACTIC ACID, VENOUS: 16.4 mmol/L — AB (ref 0.5–2.0)

## 2014-09-12 LAB — APTT: aPTT: 38 seconds — ABNORMAL HIGH (ref 24–37)

## 2014-09-12 LAB — PHOSPHORUS: Phosphorus: 5.3 mg/dL — ABNORMAL HIGH (ref 2.5–4.6)

## 2014-09-12 LAB — LIPASE, BLOOD: Lipase: 26 U/L (ref 22–51)

## 2014-09-12 MED ORDER — SENNOSIDES 8.8 MG/5ML PO SYRP
5.0000 mL | ORAL_SOLUTION | Freq: Two times a day (BID) | ORAL | Status: DC | PRN
  Filled 2014-09-12: qty 5

## 2014-09-12 MED ORDER — IOHEXOL 350 MG/ML SOLN
100.0000 mL | Freq: Once | INTRAVENOUS | Status: AC | PRN
  Administered 2014-09-12: 100 mL via INTRAVENOUS

## 2014-09-12 MED ORDER — SODIUM CHLORIDE 0.9 % IV SOLN
INTRAVENOUS | Status: DC
Start: 2014-09-12 — End: 2014-09-12

## 2014-09-12 MED ORDER — EPINEPHRINE HCL 0.1 MG/ML IJ SOSY
PREFILLED_SYRINGE | INTRAMUSCULAR | Status: DC | PRN
  Administered 2014-09-12 (×2): 1 mg via INTRAVENOUS

## 2014-09-12 MED ORDER — HEPARIN BOLUS VIA INFUSION
4500.0000 [IU] | Freq: Once | INTRAVENOUS | Status: AC
  Administered 2014-09-12: 4500 [IU] via INTRAVENOUS
  Filled 2014-09-12: qty 4500

## 2014-09-12 MED ORDER — ASPIRIN 300 MG RE SUPP: 300.0000 mg | RECTAL | Status: AC

## 2014-09-12 MED ORDER — SODIUM CHLORIDE 0.9 % IV SOLN
INTRAVENOUS | Status: AC
  Administered 2014-09-12: 19:00:00 via INTRAVENOUS

## 2014-09-12 MED ORDER — ROCURONIUM BROMIDE 50 MG/5ML IV SOLN
INTRAVENOUS | Status: AC
  Filled 2014-09-12: qty 2

## 2014-09-12 MED ORDER — ALBUTEROL SULFATE (2.5 MG/3ML) 0.083% IN NEBU: 2.5000 mg | INHALATION_SOLUTION | RESPIRATORY_TRACT | Status: DC | PRN

## 2014-09-12 MED ORDER — IPRATROPIUM-ALBUTEROL 0.5-2.5 (3) MG/3ML IN SOLN: 3.0000 mL | RESPIRATORY_TRACT | Status: DC | PRN

## 2014-09-12 MED ORDER — SODIUM CHLORIDE 0.9 % IV SOLN
25.0000 ug/h | INTRAVENOUS | Status: DC
  Administered 2014-09-12: 25 ug/h via INTRAVENOUS
  Filled 2014-09-12: qty 50

## 2014-09-12 MED ORDER — ETOMIDATE 2 MG/ML IV SOLN
INTRAVENOUS | Status: AC | PRN
  Administered 2014-09-12: 20 mg via INTRAVENOUS

## 2014-09-12 MED ORDER — SUCCINYLCHOLINE CHLORIDE 20 MG/ML IJ SOLN
INTRAMUSCULAR | Status: AC
  Filled 2014-09-12: qty 1

## 2014-09-12 MED ORDER — POTASSIUM CHLORIDE 20 MEQ/15ML (10%) PO SOLN
40.0000 meq | Freq: Once | ORAL | Status: AC
  Administered 2014-09-12: 40 meq
  Filled 2014-09-12: qty 30

## 2014-09-12 MED ORDER — CHLORHEXIDINE GLUCONATE 0.12 % MT SOLN
15.0000 mL | Freq: Two times a day (BID) | OROMUCOSAL | Status: DC
  Administered 2014-09-12 – 2014-09-13 (×2): 15 mL via OROMUCOSAL
  Filled 2014-09-12 (×2): qty 15

## 2014-09-12 MED ORDER — STERILE WATER FOR INJECTION IV SOLN
INTRAVENOUS | Status: DC
  Administered 2014-09-12 – 2014-09-13 (×2): via INTRAVENOUS
  Filled 2014-09-12 (×3): qty 850

## 2014-09-12 MED ORDER — HEPARIN (PORCINE) IN NACL 100-0.45 UNIT/ML-% IJ SOLN
1250.0000 [IU]/h | INTRAMUSCULAR | Status: DC
  Administered 2014-09-12: 1250 [IU]/h via INTRAVENOUS
  Filled 2014-09-12: qty 250

## 2014-09-12 MED ORDER — SODIUM CHLORIDE 0.9 % IV BOLUS (SEPSIS)
2000.0000 mL | Freq: Once | INTRAVENOUS | Status: AC
  Administered 2014-09-12: 2000 mL via INTRAVENOUS

## 2014-09-12 MED ORDER — IPRATROPIUM-ALBUTEROL 0.5-2.5 (3) MG/3ML IN SOLN: 3.0000 mL | Freq: Four times a day (QID) | RESPIRATORY_TRACT | Status: DC

## 2014-09-12 MED ORDER — POTASSIUM CHLORIDE 20 MEQ/15ML (10%) PO SOLN: 20.0000 meq | Freq: Once | ORAL | Status: DC

## 2014-09-12 MED ORDER — SODIUM CHLORIDE 0.9 % IV SOLN: 250.0000 mL | INTRAVENOUS | Status: DC | PRN

## 2014-09-12 MED ORDER — ONDANSETRON HCL 4 MG/2ML IJ SOLN
4.0000 mg | Freq: Four times a day (QID) | INTRAMUSCULAR | Status: DC | PRN
  Administered 2014-09-13: 4 mg via INTRAVENOUS
  Filled 2014-09-12: qty 2

## 2014-09-12 MED ORDER — FENTANYL BOLUS VIA INFUSION
50.0000 ug | INTRAVENOUS | Status: DC | PRN
  Filled 2014-09-12: qty 50

## 2014-09-12 MED ORDER — FENTANYL CITRATE (PF) 100 MCG/2ML IJ SOLN: 50.0000 ug | Freq: Once | INTRAMUSCULAR | Status: DC

## 2014-09-12 MED ORDER — SODIUM CHLORIDE 0.9 % IV BOLUS (SEPSIS)
500.0000 mL | Freq: Once | INTRAVENOUS | Status: DC
  Administered 2014-09-12: 500 mL via INTRAVENOUS

## 2014-09-12 MED ORDER — PROPOFOL 1000 MG/100ML IV EMUL
5.0000 ug/kg/min | Freq: Once | INTRAVENOUS | Status: AC
Start: 2014-09-12 — End: 2014-09-12
  Administered 2014-09-12: 5 ug/kg/min via INTRAVENOUS
  Filled 2014-09-12 (×2): qty 100

## 2014-09-12 MED ORDER — ACETAMINOPHEN 325 MG PO TABS: 650.0000 mg | ORAL_TABLET | ORAL | Status: DC | PRN

## 2014-09-12 MED ORDER — LIDOCAINE HCL (CARDIAC) 20 MG/ML IV SOLN
INTRAVENOUS | Status: AC
  Filled 2014-09-12: qty 5

## 2014-09-12 MED ORDER — CETYLPYRIDINIUM CHLORIDE 0.05 % MT LIQD
7.0000 mL | Freq: Four times a day (QID) | OROMUCOSAL | Status: DC
  Administered 2014-09-13 (×2): 7 mL via OROMUCOSAL

## 2014-09-12 MED ORDER — ETOMIDATE 2 MG/ML IV SOLN
INTRAVENOUS | Status: AC
  Filled 2014-09-12: qty 20

## 2014-09-12 MED ORDER — ASPIRIN 81 MG PO CHEW
324.0000 mg | CHEWABLE_TABLET | ORAL | Status: AC
  Administered 2014-09-12: 324 mg via ORAL
  Filled 2014-09-12: qty 4

## 2014-09-12 MED ORDER — PROMETHAZINE HCL 25 MG/ML IJ SOLN
6.2500 mg | Freq: Once | INTRAMUSCULAR | Status: DC
  Filled 2014-09-12: qty 1

## 2014-09-12 MED ORDER — INSULIN ASPART 100 UNIT/ML ~~LOC~~ SOLN
0.0000 [IU] | SUBCUTANEOUS | Status: DC
  Administered 2014-09-12: 3 [IU] via SUBCUTANEOUS
  Administered 2014-09-13 (×3): 2 [IU] via SUBCUTANEOUS

## 2014-09-12 MED ORDER — FAMOTIDINE 40 MG/5ML PO SUSR
20.0000 mg | Freq: Two times a day (BID) | ORAL | Status: DC
  Administered 2014-09-12: 20 mg
  Filled 2014-09-12 (×3): qty 2.5

## 2014-09-12 MED ORDER — SODIUM CHLORIDE 0.9 % IV SOLN: INTRAVENOUS | Status: DC

## 2014-09-12 MED ORDER — SODIUM BICARBONATE 8.4 % IV SOLN: INTRAVENOUS | Status: DC

## 2014-09-12 NOTE — Progress Notes (Signed)
ANTICOAGULATION CONSULT NOTE - Initial Consult  Pharmacy Consult for heparin Indication: pulmonary embolus  No Known Allergies  Patient Measurements: Height: 5\' 10"  (177.8 cm) Weight: 164 lb 14.5 oz (74.8 kg) IBW/kg (Calculated) : 73 Heparin Dosing Weight: 75  Vital Signs: Temp: 98.6 F (37 C) (07/16 1720) Temp Source: Axillary (07/16 1526) BP: 108/70 mmHg (07/16 1900) Pulse Rate: 126 (07/16 1900)  Labs:  Recent Labs  09/24/2014 1532 09/25/2014 1559  HGB 15.6 17.3*  HCT 48.3 51.0  PLT 169  --   CREATININE 1.20 0.90    Estimated Creatinine Clearance: 95.8 mL/min (by C-G formula based on Cr of 0.9).   Medical History: Past Medical History  Diagnosis Date  . MS (multiple sclerosis)   . Hypertension   . Hyperlipidemia   . Anemia   . Type II diabetes mellitus   . GERD (gastroesophageal reflux disease)   . Depression   . Dysphagia 05/2014  . Slurred speech 05/2014  . Right sided weakness 05/2014   Assessment: 54 year old male presenting with a PEA Arrest. Presented today with N/V, developed tachypnea and mental status changes leading to PEA arrest  PMH: MS, HTN, HLD, Anemia, DM II, Dysphagia  AC: None PTA heparin for acute PE.  CV: PE with right heart strain indicating submassive PE  Heme: H&H 17.3/51. Plt 169  Goal of Therapy:  Heparin level 0.3-0.7 units/ml Monitor platelets by anticoagulation protocol: Yes   Plan:  -Initiate heparin with a bolus of 4500 units  -Infusion of 1250 units/hr -Baseline APTT, PT, INR stat -0100 HL -Daily HL, CBC -Monitor for s/sx of bleeding  Isaac Bliss, PharmD, BCPS Clinical Pharmacist Pager [phone] 09/21/2014 7:07 PM

## 2014-09-12 NOTE — ED Notes (Signed)
Pt has MS, per caregiver pt ate lunch and then suddenly had eyes roll back into his head and began n/v and appears sob. Pt speech is incomprehensible at triage, caregiver states his speech is normally clearer, reports pt has complained of back pain pta.

## 2014-09-12 NOTE — Progress Notes (Signed)
  Echocardiogram 2D Echocardiogram has been performed.  Delcie Roch 09/11/2014, 5:21 PM

## 2014-09-12 NOTE — H&P (Signed)
PULMONARY / CRITICAL CARE MEDICINE   Name: Mario Shields MRN: [id number] DOB: [birth year]    ADMISSION DATE:  09/25/2014 CONSULTATION DATE:  09/03/2014  REFERRING MD :  Dr. Clarene Duke   CHIEF COMPLAINT:  PEA Arrest  INITIAL PRESENTATION: 54 y/o M with PMH of MS (right sided weakness, chair bound at baseline) who presented to [facility] ER on 7/16 after an episode of N/V.  He developed tachypnea and mental status changes, then suffered a PEA arrest.  Intubated in ER, PCCM called for further evaluation.   STUDIES:  7/16  CTA Chest >>  7/16  CT Head >>   SIGNIFICANT EVENTS: 7/16  Admit with n/v, AMS.  PEA arrest while in ER.  Intubated.     HISTORY OF PRESENT ILLNESS:  54 y/o M, smoker, with PMH of MS (right sided weakness, chair bound at baseline), HTN, HLD, Anemia, DM II, GERD, Depression, known baseline slurred speech, Dysphagia, and appendectomy who presented to [facility] ER on 7/16 with acute change in mental status after eating lunch with associated N/V.    The patient was reportedly out with family for lunch which was uneventful.  While in car, he developed n/v with acute mental status change and family drove straight to the ER.  Prior to becoming unresponsive, he complained of non-specific back pain.  In the ER, he was noted to have tachypnea, altered mental status, and worsened slurred speech.  He subsequently became bradycardic and unresponsive.  Further, developed PEA arrest requiring 3 minutes of CPR, 2 epi with return of circulation.   He was intubated per EDP.  Labs notable for:  Na 139, K 3.0, Cl 104, BUN 13, glucose 333, troponin 0.09, lactic acid of 16.40, WBC 25, Hgb 17.3, and platelets of 169.   EKG showed ST with a new RBBB.  PCCM called for ICU admission.     At baseline, the patient reportedly does not walk, is essentially chair bound.  He has known right sided weakness and baseline slurred speech.  The patient was recently admitted 4/6-4/10/16 for E-Coli UTI and sepsis.   PAST MEDICAL  HISTORY :   has a past medical history of MS (multiple sclerosis); Hypertension; Hyperlipidemia; Anemia; Type II diabetes mellitus; GERD (gastroesophageal reflux disease); Depression; Dysphagia (05/2014); Slurred speech (05/2014); and Right sided weakness (05/2014).  has past surgical history that includes Appendectomy.    Prior to Admission medications   Medication Sig Start Date End Date Taking? Authorizing Provider  aspirin EC 81 MG tablet Take 81 mg by mouth daily.    Historical Provider, MD  atorvastatin (LIPITOR) 40 MG tablet Take 40 mg by mouth at bedtime.    Historical Provider, MD  BAYER CONTOUR TEST test strip 1 each by Other route as needed.  05/17/12   Historical Provider, MD  buPROPion (WELLBUTRIN SR) 150 MG 12 hr tablet Take 150 mg by mouth 2 (two) times daily.    Historical Provider, MD  cholecalciferol (VITAMIN D) 1000 UNITS tablet Take 1,000 Units by mouth daily.    Historical Provider, MD  Dimethyl Fumarate (TECFIDERA) 240 MG CPDR Take 1 capsule (240 mg total) by mouth 2 (two) times daily. 06/25/13   Suanne Marker, MD  docusate sodium (COLACE) 100 MG capsule Take 1 capsule (100 mg total) by mouth 2 (two) times daily. 06/07/14   Marrian Salvage, MD  insulin glargine (LANTUS) 100 UNIT/ML injection Inject 30 Units into the skin daily. Patient taking differently: Inject 50 Units into the skin daily.  04/19/12  Jarome Matin, MD  metFORMIN (GLUCOPHAGE-XR) 500 MG 24 hr tablet Take 1,000 mg by mouth daily with breakfast.    Historical Provider, MD  Multiple Vitamin (MULTIVITAMIN WITH MINERALS) TABS Take 1 tablet by mouth daily.    Historical Provider, MD  pantoprazole (PROTONIX) 40 MG tablet Take 1 tablet (40 mg total) by mouth daily at 6 (six) AM. 04/19/12   Jarome Matin, MD  valsartan (DIOVAN) 80 MG tablet Take 1 tablet (80 mg total) by mouth daily. 06/08/14   Marrian Salvage, MD   No Known Allergies  FAMILY HISTORY:  has no family status information on file.    SOCIAL  HISTORY:  reports that he has been smoking Cigarettes.  He has a 20 pack-year smoking history. He has never used smokeless tobacco. He reports that he does not drink alcohol or use illicit drugs.  REVIEW OF SYSTEMS:  Unable to complete with patient as altered post arrest.   SUBJECTIVE:   VITAL SIGNS: Temp:  [97.9 F (36.6 C)] 97.9 F (36.6 C) (07/16 1526) Pulse Rate:  [61-142] 123 (07/16 1650) Resp:  [15-32] 28 (07/16 1650) BP: (115-187)/(51-92) 125/73 mmHg (07/16 1630) SpO2:  [95 %-100 %] 99 % (07/16 1650) FiO2 (%):  [100 %] 100 % (07/16 1604) Weight:  [167 lb 1.7 oz (75.8 kg)] 167 lb 1.7 oz (75.8 kg) (07/16 1604)   HEMODYNAMICS:     VENTILATOR SETTINGS: Vent Mode:  [-] PRVC FiO2 (%):  [100 %] 100 % Set Rate:  [14 bmp] 14 bmp Vt Set:  [590 mL] 590 mL PEEP:  [5 cmH20] 5 cmH20 Plateau Pressure:  [15 cmH20] 15 cmH20   INTAKE / OUTPUT: No intake or output data in the 24 hours ending 08/29/2014 1657  PHYSICAL EXAMINATION: General:  Thin adult male in NAD on mechanical vent Neuro:  Sedate, no distress, moving LUE spontaneously, reaching for ETT HEENT:  OETT, MM pink/moist, no jvd Cardiovascular:  s1s2 rrr, no m/r/g, tachy on monitor  Lungs:  resp's even/non-labored, lungs bilaterally clear  Abdomen:  ND, bsx4 hypoactive  Musculoskeletal:  No acute deformities, chronic LE deformities from non-weight bearing status, RUE contracture Skin:  Warm/dry, no edema  LABS:  CBC  Recent Labs Lab 09/03/2014 1532 09/24/2014 1559  WBC 25.0*  --   HGB 15.6 17.3*  HCT 48.3 51.0  PLT 169  --    Coag's No results for input(s): APTT, INR in the last 168 hours.   BMET  Recent Labs Lab 08/29/2014 1559  NA 139  K 3.0*  CL 104  BUN 13  CREATININE 0.90  GLUCOSE 333*   Electrolytes No results for input(s): CALCIUM, MG, PHOS in the last 168 hours.   Sepsis Markers  Recent Labs Lab 08/29/2014 1602  LATICACIDVEN 16.40*   ABG No results for input(s): PHART, PCO2ART, PO2ART in the  last 168 hours.   Liver Enzymes No results for input(s): AST, ALT, ALKPHOS, BILITOT, ALBUMIN in the last 168 hours.   Cardiac Enzymes No results for input(s): TROPONINI, PROBNP in the last 168 hours.   Glucose No results for input(s): GLUCAP in the last 168 hours.  Imaging Dg Chest Portable 1 View  09/02/2014   CLINICAL DATA:  ETT placement  EXAM: PORTABLE CHEST - 1 VIEW  COMPARISON:  06/05/2014  FINDINGS: Lungs are clear.  No pleural effusion or pneumothorax.  The heart is top-normal in size.  Endotracheal tube terminates 6 cm above the carina.  Defibrillator pads overlying the left hemithorax.  IMPRESSION: Endotracheal tube  terminates 6 cm above the carina.  No evidence of acute cardiopulmonary disease.   Electronically Signed   By: Charline Bills M.D.   On: 09/24/2014 16:16   Dg Abd Portable 1v  09/10/2014   CLINICAL DATA:  Check nasogastric catheter placement  EXAM: PORTABLE ABDOMEN - 1 VIEW  COMPARISON:  None.  FINDINGS: Scattered large and small bowel gas is noted. A nasogastric catheter is noted within the stomach. No acute bony abnormality is seen.  IMPRESSION: Nasogastric catheter within the stomach.   Electronically Signed   By: Alcide Clever M.D.   On: 09/03/2014 16:19     ASSESSMENT / PLAN:  PULMONARY OETT 7/16 >>  A: Acute Respiratory Failure - in setting of PEA arrest  R/O Pulmonary Embolism  Tobacco Abuse  P:   MV support, 8cc/kg Follow up ABG, CXR Assess CTA Chest to r/o PE with PEA arrest Await results for CTA chest, then begin heparin gtt if CT head negative for acute process  Duoneb PRN   CARDIOVASCULAR CVL A:  PEA Arrest - RV strain noted on ECHO  New RBBB Hx HTN, HLD P:  ICU admit / tele monitoring  Follow up am EKG Cardiology consulted, appreciate input  Heparin gtt as above   RENAL A:   Metabolic Acidosis / Lactic Acidosis - 16.40 on admit, on metformin PTA Hypokalemia  At Risk AKI - in setting of PEA arrest  P:   Trend BMP / UOP  Follow  up lactic acid at 1800 KCL 40 mEq per tube x 1 D5 with 3 amps bicarb at 100 ml/hr  GASTROINTESTINAL A:   Nausea / Vomiting  GERD  Dysphagia  P:   NPO Insert OGT Assess CT Abd/Pelvis with significantly elevated lactic acid and N/V  PRN zofran  Pepcid for DVT proplylaxis   HEMATOLOGIC A:   R/O PE  P:  Hold heparin gtt until CTA chest & CT head assessed  SCD's for now  INFECTIOUS A:   Nausea / Vomiting  P:   BCx2 7/16 >>  UC 7/16 >>  Sputum 7/16 >>  Zosyn, start date 7/16, day 1/x   Empirically cover with zosyn given recent admit, N/V and possible aspiration with AMS   ENDOCRINE A:   DM II / Hyperglycemia    P:   SSI  Hold home metformin  NEUROLOGIC A:   Depression  MS Right Sided Weakness  P:   RASS goal: -1 Fentanyl gtt for pain  Supportive care PT consult once extubated    FAMILY  - Updates: No family available   - Inter-disciplinary family meet or Palliative Care meeting due by: 7/22   Canary Brim, NP-C [facility] Pgr: [phone] or if no answer [phone] 09/25/2014, 4:57 PM   54 yo male presented to ED with n/v.  He developed arrhythmia, altered mental status, and then PEA arrest.  Eventually resuscitated.  Noted to have significant metabolic acidosis.  Of note is that he is on metformin.  He had cardiology evaluation in ER.  Echo showed RV dilation and strain with PAS 36 mmHg, but normal LV function.  He is obtunded, tachycardic, increased RR, abdomen soft, no edema.  Labs show metabolic acidosis, leukocytosis, hypokalemia, hyperglycemia.  CXR unrevealing.  Concern for acute PE causing hypoperfusion with metabolic acidosis due to this, plus meformin use.  This could have lead to metabolic derangements, altered mental status and PEA arrest.  Will check CT head/chest/abd, and then decide if he needs heparin  gtt.  Depending on CT chest results will decide whether to consider systemic thrombolytic versus catheter directed  thrombolytic if he is found to have PE.  Will add HCO3 to IV fluid and f/u labs.  D/w Dr. Eldridge Dace.  CC time by me independent of APP time is 45 minutes.  Coralyn Helling, MD [facility] Pulmonary/Critical Care 09/21/2014, 6:10 PM Pager:  [phone] After 3pm call: [phone]

## 2014-09-12 NOTE — ED Notes (Signed)
Pt began becoming bradycardic and less responsive. MD Clarene Duke called to the bedside. Md at the bedside now.

## 2014-09-12 NOTE — Progress Notes (Signed)
Patient arrived to CCU, patient alert and follows commands. Radiologist called to notify result of acute bilateral PE with right heart strain. [facility] MD aware, new orders given. Will continue to monitor.

## 2014-09-12 NOTE — Consult Note (Addendum)
CARDIOLOGY CONSULT NOTE      Patient ID: ABOUBACAR REICHNER MRN: [id number] DOB/AGE: 1960/08/02 54 y.o.  Admit date: 10/01/2014 Referring PhysicianKathleen Clarene Duke, DO Primary Polo Riley, MD Primary Cardiologist new Reason for Consultation bradycardia, resp arrest  HPI: 54 y/o man with HTN and DM.  He was having nausea and vomiting. He had syncope in the automobile while coming to the emergency room. He was awake when he got here and spoke to the emergency room physician. During his stay, he became bradycardic and then finally unresponsive. He was intubated. He did receive epinephrine. He was in PE a for a short period of time but came back after the medication. We were called to evaluate.  ECG showed sinus tachycardia with a new right bundle branch block. There was deviation in the ST segment but it was not clearly in the pattern of a STEMI. His right bundle branch block resolved and then the ST segments came back to normal. Currently, he is intubated and sedated and blood pressure is stable. Critical care will also be evaluating this patient.  Review of systems complete and found to be negative unless listed above   Past Medical History  Diagnosis Date  . MS (multiple sclerosis)   . Hypertension   . Hyperlipidemia   . Anemia   . Type II diabetes mellitus   . GERD (gastroesophageal reflux disease)   . Depression   . Dysphagia 05/2014  . Slurred speech 05/2014  . Right sided weakness 05/2014    Family History  Problem Relation Age of Onset  . Cancer Father   . Diabetes    . Hypertension    . Heart disease    . Cancer      History   Social History  . Marital Status: Single    Spouse Name: N/A  . Number of Children: N/A  . Years of Education: N/A   Occupational History  . Not on file.   Social History Main Topics  . Smoking status: Current Every Day Smoker -- 0.50 packs/day for 40 years    Types: Cigarettes  . Smokeless tobacco: Never Used  . Alcohol Use:  No  . Drug Use: No  . Sexual Activity: No   Other Topics Concern  . Not on file   Social History Narrative   Lives at home with brother and sister who take care of him. [place] to [place] 2-3 days per week.     Past Surgical History  Procedure Laterality Date  . Appendectomy        (Not in a hospital admission)  Physical Exam: Vitals:   Filed Vitals:   2014-10-01 1604 10/01/14 1615 2014-10-01 1630 10-01-14 1650  BP:  116/89 125/73   Pulse:   128 123  Temp:      TempSrc:      Resp:  25 25 28   Height: 5\' 10"  (1.778 m)     Weight:      SpO2:   95% 99%   I&O's:  No intake or output data in the 24 hours ending 2014/10/01 1653 Physical exam: Intubated, sedated [place]/AT EOMI No JVD, No carotid bruit Tachycardic S1S2  No wheezing Soft. NT, nondistended No edema. Intubated, sedated  Labs:   Lab Results  Component Value Date   WBC 25.0* 10-01-14   HGB 17.3* 10/01/14   HCT 51.0 Oct 01, 2014   MCV 93.6 10-01-2014   PLT 169 10-01-2014    Recent Labs Lab 10-01-14 1559  NA 139  K 3.0*  CL 104  BUN 13  CREATININE 0.90  GLUCOSE 333*   Lab Results  Component Value Date   TROPONINI <0.03 06/03/2014   No results found for: CHOL No results found for: HDL No results found for: LDLCALC No results found for: TRIG No results found for: CHOLHDL No results found for: LDLDIRECT    Chest x-ray pending EKG: As noted above  ASSESSMENT AND PLAN:  Due to the abnormal ECG, I obtained a stat echocardiogram. I was present while the echocardiogram was being performed. This revealed hyperdynamic left ventricular function. The right ventricle was dilated and hypokinetic. The pattern is concerning for an acute pulmonary embolus. I spoke to Dr. Craige Cotta from pulmonary critical care. CT scan will be obtained.  Patient is quite acidotic as well. This may be from hypoperfusion. I don't think his primary issue is cardiac or coronary ischemia despite his several risk factors for ischemia including  hypertension and diabetes.. Repeat ECG did not show signs of a STEMI. He will likely be heparinized with further workup of pulmonary embolism.  We'll follow.  Critical care time 45 minutes  Signed:   Fredric Mare, MD, [facility] 09/18/2014, 4:53 PM

## 2014-09-12 NOTE — ED Provider Notes (Signed)
CSN: [id number]     Arrival date & time 09/27/2014  1500 History   First MD Initiated Contact with Patient Sep 27, 2014 1528     Chief Complaint  Patient presents with  . Emesis  . Shortness of Breath  . Loss of Consciousness      Patient is a 54 y.o. male presenting with vomiting, shortness of breath, and syncope. The history is provided by a caregiver. The history is limited by the condition of the patient (AMS).  Emesis Shortness of Breath Associated symptoms: syncope and vomiting   Loss of Consciousness Associated symptoms: shortness of breath and vomiting   Pt was seen at 1530. Per pt's caregivers: Pt's caregivers state they went out to eat with pt, got him in the car and "then his eyes rolled back in his head," he became unresponsive, followed by N/V. Pt's only complaint today was "back pain," unspecific as to where. Pt is currently breathing rapidly and moaning on arrival to exam room.    Past Medical History  Diagnosis Date  . MS (multiple sclerosis)   . Hypertension   . Hyperlipidemia   . Anemia   . Type II diabetes mellitus   . GERD (gastroesophageal reflux disease)   . Depression   . Dysphagia 05/2014  . Slurred speech 05/2014  . Right sided weakness 05/2014   Past Surgical History  Procedure Laterality Date  . Appendectomy     Family History  Problem Relation Age of Onset  . Cancer Father   . Diabetes    . Hypertension    . Heart disease    . Cancer     History  Substance Use Topics  . Smoking status: Current Every Day Smoker -- 0.50 packs/day for 40 years    Types: Cigarettes  . Smokeless tobacco: Never Used  . Alcohol Use: No    Review of Systems  Unable to perform ROS: Mental status change  Respiratory: Positive for shortness of breath.   Cardiovascular: Positive for syncope.  Gastrointestinal: Positive for vomiting.      Allergies  Review of patient's allergies indicates no known allergies.  Home Medications   Prior to Admission medications    Medication Sig Start Date End Date Taking? Authorizing Provider  aspirin EC 81 MG tablet Take 81 mg by mouth daily.    Historical Provider, MD  atorvastatin (LIPITOR) 40 MG tablet Take 40 mg by mouth at bedtime.    Historical Provider, MD  BAYER CONTOUR TEST test strip 1 each by Other route as needed.  05/17/12   Historical Provider, MD  buPROPion (WELLBUTRIN SR) 150 MG 12 hr tablet Take 150 mg by mouth 2 (two) times daily.    Historical Provider, MD  cholecalciferol (VITAMIN D) 1000 UNITS tablet Take 1,000 Units by mouth daily.    Historical Provider, MD  Dimethyl Fumarate (TECFIDERA) 240 MG CPDR Take 1 capsule (240 mg total) by mouth 2 (two) times daily. 06/25/13   Suanne Marker, MD  docusate sodium (COLACE) 100 MG capsule Take 1 capsule (100 mg total) by mouth 2 (two) times daily. 06/07/14   Marrian Salvage, MD  insulin glargine (LANTUS) 100 UNIT/ML injection Inject 30 Units into the skin daily. Patient taking differently: Inject 50 Units into the skin daily.  04/19/12   Jarome Matin, MD  metFORMIN (GLUCOPHAGE-XR) 500 MG 24 hr tablet Take 1,000 mg by mouth daily with breakfast.    Historical Provider, MD  Multiple Vitamin (MULTIVITAMIN WITH MINERALS) TABS Take 1 tablet by  mouth daily.    Historical Provider, MD  pantoprazole (PROTONIX) 40 MG tablet Take 1 tablet (40 mg total) by mouth daily at 6 (six) AM. 04/19/12   Jarome Matin, MD  valsartan (DIOVAN) 80 MG tablet Take 1 tablet (80 mg total) by mouth daily. 06/08/14   Marrian Salvage, MD   BP 115/82 mmHg  Pulse 142  Temp(Src) 97.9 F (36.6 C) (Axillary)  Resp 32  Ht 5\' 10"  (1.778 m)  SpO2 97%   Filed Vitals:   10-12-14 1604 2014/10/12 1615 10-12-2014 1630 10/12/2014 1650  BP:  116/89 125/73   Pulse:   128 123  Temp:      TempSrc:      Resp:  25 25 28   Height: 5\' 10"  (1.778 m)     Weight:      SpO2:   95% 99%      Physical Exam  1535: Physical examination:  Nursing notes reviewed; Vital signs and O2 SAT reviewed;   Constitutional: Well developed, Well nourished, Uncomfortable appearing.; Head:  Normocephalic, atraumatic; Eyes: EOMI, PERRL, No scleral icterus; ENMT: Mouth and pharynx normal, Mucous membranes dry; Neck: Supple, Full range of motion, No lymphadenopathy; Cardiovascular: Tachycardic rate and rhythm, No gallop; Respiratory: Breath sounds diminished & equal bilaterally, No wheezes. Tachypneic.; Chest: Nontender, Movement normal; Abdomen: +food emesis on clothing. Soft, Nontender, Nondistended, Normal bowel sounds; Genitourinary: No CVA tenderness; Spine:  No midline CS, TS, LS tenderness.;; Extremities: Pulses palpable and equal x4 extremities. No tenderness, No edema, No calf edema or asymmetry.; Neuro: Awake, alert, eyes open spontaneously, moaning. Right sided weakness per hx, moves LUE and LLE spontaneously on stretcher.; Skin: Color normal, Warm, Dry.    ED Course  Procedures    Cardiopulmonary Resuscitation (CPR) Procedure Note Directed/Performed by: Laray Anger I personally directed ancillary staff and/or performed CPR in an effort to regain return of spontaneous circulation and to maintain cardiac, neuro and systemic perfusion.   Airway procedure:  Timeout: Pre-procedure timeout not performed due to emergent nature of procedure; Indication: Unresponsive;  Oxygen Saturation: 100 %; Oxygen concentration: 100 %; Preoxygenation: Bag-valve-mask;  Medication: Etomidate; Procedure: Suctioning, RSI, Glidescope laryngoscopy, Endotracheal intubation with 7.64mm cuffed endotracheal tube, Bag-valve-tube ventilation, Mechanical ventilation;  Reassessment: Successful intubation, No bleeding or obvious trauma in oral cavity or posterior pharynx. Breath sounds equal bilaterally, No breath sounds heard over stomach, Chest movement symmetrical, CO2 detector color change, Endotracheal tube fogging, Oxygen saturation normal. Post-procedure xray obtained.      EKG Interpretation   Date/Time:   Saturday 10-12-2014 15:23:21 EDT on arrival Ventricular Rate:  147 PR Interval:  86 QRS Duration: 151 QT Interval:  353 QTC Calculation: 552 R Axis:   -46 Text Interpretation:  Sinus tachycardia RBBB and LAFB When compared with  ECG of 06/03/2014 Right bundle branch block is now Present Confirmed by  [facility]  MD, Nicholos Johns [id number]) on 12-Oct-2014 3:43:13 PM      EKG Interpretation  Date/Time:  Saturday 12-Oct-2014 16:39:26 EDT repeat Ventricular Rate:  125 PR Interval:  86 QRS Duration: 112 QT Interval:  311 QTC Calculation: 448 R Axis:   -15 Text Interpretation:  Sinus tachycardia LAD, consider left anterior fascicular block Nonspecific ST and T wave abnormality Diffuse Rate slower Since last tracing of earlier today Confirmed by [facility]  MD, Nicholos Johns [phone]) on 10-12-2014 4:45:51 PM         MDM  MDM Reviewed: previous chart, nursing note and vitals Reviewed previous: labs and ECG Interpretation: labs, ECG  and x-ray Total time providing critical care: 30-74 minutes. This excludes time spent performing separately reportable procedures and services. Consults: critical care and cardiology   CRITICAL CARE Performed by: Laray Anger Total critical care time: 45 Critical care time was exclusive of separately billable procedures and treating other patients. Critical care was necessary to treat or prevent imminent or life-threatening deterioration. Critical care was time spent personally by me on the following activities: development of treatment plan with patient and/or surrogate as well as nursing, discussions with consultants, evaluation of patient's response to treatment, examination of patient, obtaining history from patient or surrogate, ordering and performing treatments and interventions, ordering and review of laboratory studies, ordering and review of radiographic studies, pulse oximetry and re-evaluation of patient's condition.    Results for orders placed or  performed during the hospital encounter of 08/30/2014  CBC  Result Value Ref Range   WBC 25.0 (H) 4.0 - 10.5 K/uL   RBC 5.16 4.22 - 5.81 MIL/uL   Hemoglobin 15.6 13.0 - 17.0 g/dL   HCT 52.8 41.3 - 24.4 %   MCV 93.6 78.0 - 100.0 fL   MCH 30.2 26.0 - 34.0 pg   MCHC 32.3 30.0 - 36.0 g/dL   RDW 01.0 27.2 - 53.6 %   Platelets 169 150 - 400 K/uL  I-stat troponin, ED  Result Value Ref Range   Troponin i, poc 0.09 (HH) 0.00 - 0.08 ng/mL   Comment NOTIFIED PHYSICIAN    Comment 3          I-Stat Chem 8, ED  Result Value Ref Range   Sodium 139 135 - 145 mmol/L   Potassium 3.0 (L) 3.5 - 5.1 mmol/L   Chloride 104 101 - 111 mmol/L   BUN 13 6 - 20 mg/dL   Creatinine, Ser 6.44 0.61 - 1.24 mg/dL   Glucose, Bld 034 (H) 65 - 99 mg/dL   Calcium, Ion 7.42 5.95 - 1.23 mmol/L   TCO2 15 0 - 100 mmol/L   Hemoglobin 17.3 (H) 13.0 - 17.0 g/dL   HCT 63.8 75.6 - 43.3 %  I-Stat CG4 Lactic Acid, ED  Result Value Ref Range   Lactic Acid, Venous 16.40 (HH) 0.5 - 2.0 mmol/L   Comment NOTIFIED PHYSICIAN     Dg Chest Portable 1 View 09/20/2014   CLINICAL DATA:  ETT placement  EXAM: PORTABLE CHEST - 1 VIEW  COMPARISON:  06/05/2014  FINDINGS: Lungs are clear.  No pleural effusion or pneumothorax.  The heart is top-normal in size.  Endotracheal tube terminates 6 cm above the carina.  Defibrillator pads overlying the left hemithorax.  IMPRESSION: Endotracheal tube terminates 6 cm above the carina.  No evidence of acute cardiopulmonary disease.   Electronically Signed   By: Charline Bills M.D.   On: 09/11/2014 16:16   Dg Abd Portable 1v 09/06/2014   CLINICAL DATA:  Check nasogastric catheter placement  EXAM: PORTABLE ABDOMEN - 1 VIEW  COMPARISON:  None.  FINDINGS: Scattered large and small bowel gas is noted. A nasogastric catheter is noted within the stomach. No acute bony abnormality is seen.  IMPRESSION: Nasogastric catheter within the stomach.   Electronically Signed   By: Alcide Clever M.D.   On: 09/01/2014 16:19     1550:  Called in to exam room by ED RN: pt becoming less responsive, resps agonal; BP slowly dropping and HR slowly dropping into 70's then 50's. Pulses then not palpable. CPR started with BVM O2. IV epi x2 doses given. +  ROSC. Equal pulses are palp in all 4 extremities. Continues to agonal resps. IV etomidate given for intubation due to +gag. Pt intubated without difficulty. Monitor now sinus tachycardia, HR 110-120's. SBP 120's. Abd remains soft.   1620:  RT to move ETT down. T/C to Cards Dr. Abe People, case discussed, including:  HPI, pertinent PM/SHx, VS/PE, dx testing, ED course and treatment:  Agreeable to consult. T/C to PCCM Dr. Arsenio Loader, case discussed, including:  HPI, pertinent PM/SHx, VS/PE, dx testing, ED course and treatment:  Agreeable to admit, obtain ICU bed, they will send staff to ED for eval.  1640:  2nd EKG obtained; Cards MD has viewed, no STEMI; he has ordered STAT echocardiogram.    Samuel Jester, DO 09/15/14 2148

## 2014-09-13 ENCOUNTER — Encounter (HOSPITAL_COMMUNITY): Payer: Self-pay

## 2014-09-13 ENCOUNTER — Inpatient Hospital Stay (HOSPITAL_COMMUNITY): Payer: Medicare (Managed Care)

## 2014-09-13 DIAGNOSIS — I2699 Other pulmonary embolism without acute cor pulmonale: Secondary | ICD-10-CM | POA: Insufficient documentation

## 2014-09-13 DIAGNOSIS — R0602 Shortness of breath: Secondary | ICD-10-CM

## 2014-09-13 LAB — BASIC METABOLIC PANEL
ANION GAP: 11 (ref 5–15)
BUN: 19 mg/dL (ref 6–20)
CO2: 22 mmol/L (ref 22–32)
Calcium: 8.1 mg/dL — ABNORMAL LOW (ref 8.9–10.3)
Chloride: 109 mmol/L (ref 101–111)
Creatinine, Ser: 1.01 mg/dL (ref 0.61–1.24)
Glucose, Bld: 173 mg/dL — ABNORMAL HIGH (ref 65–99)
POTASSIUM: 3.6 mmol/L (ref 3.5–5.1)
SODIUM: 142 mmol/L (ref 135–145)

## 2014-09-13 LAB — PHOSPHORUS: Phosphorus: 4.3 mg/dL (ref 2.5–4.6)

## 2014-09-13 LAB — CBC
HCT: 42.6 % (ref 39.0–52.0)
Hemoglobin: 14.5 g/dL (ref 13.0–17.0)
MCH: 29.8 pg (ref 26.0–34.0)
MCHC: 34 g/dL (ref 30.0–36.0)
MCV: 87.5 fL (ref 78.0–100.0)
PLATELETS: 157 10*3/uL (ref 150–400)
RBC: 4.87 MIL/uL (ref 4.22–5.81)
RDW: 14 % (ref 11.5–15.5)
WBC: 26.1 10*3/uL — ABNORMAL HIGH (ref 4.0–10.5)

## 2014-09-13 LAB — LACTIC ACID, PLASMA: Lactic Acid, Venous: 2.8 mmol/L (ref 0.5–2.0)

## 2014-09-13 LAB — POCT I-STAT 3, ART BLOOD GAS (G3+)
ACID-BASE DEFICIT: 5 mmol/L — AB (ref 0.0–2.0)
Bicarbonate: 20.1 mEq/L (ref 20.0–24.0)
O2 Saturation: 99 %
PCO2 ART: 35.9 mmHg (ref 35.0–45.0)
PH ART: 7.357 (ref 7.350–7.450)
PO2 ART: 141 mmHg — AB (ref 80.0–100.0)
Patient temperature: 98.5
TCO2: 21 mmol/L (ref 0–100)

## 2014-09-13 LAB — HEPARIN LEVEL (UNFRACTIONATED)
HEPARIN UNFRACTIONATED: 0.42 [IU]/mL (ref 0.30–0.70)
Heparin Unfractionated: 1.02 IU/mL — ABNORMAL HIGH (ref 0.30–0.70)
Heparin Unfractionated: 0.95 IU/mL — ABNORMAL HIGH (ref 0.30–0.70)

## 2014-09-13 LAB — URINE CULTURE: CULTURE: NO GROWTH

## 2014-09-13 LAB — GLUCOSE, CAPILLARY
Glucose-Capillary: 151 mg/dL — ABNORMAL HIGH (ref 65–99)
Glucose-Capillary: 186 mg/dL — ABNORMAL HIGH (ref 65–99)
Glucose-Capillary: 193 mg/dL — ABNORMAL HIGH (ref 65–99)

## 2014-09-13 LAB — MAGNESIUM: MAGNESIUM: 2 mg/dL (ref 1.7–2.4)

## 2014-09-13 MED ORDER — DIMETHYL FUMARATE 240 MG PO CPDR: 1.0000 | DELAYED_RELEASE_CAPSULE | Freq: Two times a day (BID) | ORAL | Status: DC

## 2014-09-13 MED ORDER — HEPARIN (PORCINE) IN NACL 100-0.45 UNIT/ML-% IJ SOLN
1050.0000 [IU]/h | INTRAMUSCULAR | Status: DC
  Administered 2014-09-13: 1050 [IU]/h via INTRAVENOUS
  Filled 2014-09-13 (×2): qty 250

## 2014-09-13 MED ORDER — ADULT MULTIVITAMIN W/MINERALS CH
1.0000 | ORAL_TABLET | Freq: Every day | ORAL | Status: DC
  Administered 2014-09-13: 1 via ORAL
  Filled 2014-09-13 (×2): qty 1

## 2014-09-13 MED ORDER — CETYLPYRIDINIUM CHLORIDE 0.05 % MT LIQD
7.0000 mL | Freq: Two times a day (BID) | OROMUCOSAL | Status: DC
  Administered 2014-09-13 (×2): 7 mL via OROMUCOSAL

## 2014-09-13 MED ORDER — POLYETHYLENE GLYCOL 3350 17 G PO PACK
17.0000 g | PACK | Freq: Every day | ORAL | Status: DC
  Filled 2014-09-13: qty 1

## 2014-09-13 MED ORDER — FUROSEMIDE 10 MG/ML IJ SOLN
20.0000 mg | Freq: Once | INTRAMUSCULAR | Status: AC
  Administered 2014-09-13: 20 mg via INTRAVENOUS

## 2014-09-13 MED ORDER — BUPROPION HCL ER (SR) 150 MG PO TB12
150.0000 mg | ORAL_TABLET | Freq: Two times a day (BID) | ORAL | Status: DC
  Administered 2014-09-13 (×2): 150 mg via ORAL
  Filled 2014-09-13 (×4): qty 1

## 2014-09-13 MED ORDER — MENTHOL 3 MG MT LOZG
1.0000 | LOZENGE | OROMUCOSAL | Status: DC | PRN
  Administered 2014-09-13: 3 mg via ORAL
  Filled 2014-09-13: qty 9

## 2014-09-13 MED ORDER — ASPIRIN 81 MG PO CHEW
81.0000 mg | CHEWABLE_TABLET | Freq: Every day | ORAL | Status: DC
  Administered 2014-09-13: 81 mg via ORAL
  Filled 2014-09-13: qty 1

## 2014-09-13 MED ORDER — DOCUSATE SODIUM 100 MG PO CAPS
100.0000 mg | ORAL_CAPSULE | Freq: Two times a day (BID) | ORAL | Status: DC
  Administered 2014-09-13: 100 mg via ORAL
  Filled 2014-09-13 (×4): qty 1

## 2014-09-13 MED ORDER — INSULIN ASPART 100 UNIT/ML ~~LOC~~ SOLN: 0.0000 [IU] | Freq: Every day | SUBCUTANEOUS | Status: DC

## 2014-09-13 MED ORDER — INSULIN ASPART 100 UNIT/ML ~~LOC~~ SOLN
0.0000 [IU] | Freq: Three times a day (TID) | SUBCUTANEOUS | Status: DC
  Administered 2014-09-13 (×2): 4 [IU] via SUBCUTANEOUS

## 2014-09-13 MED ORDER — FAMOTIDINE 20 MG PO TABS
20.0000 mg | ORAL_TABLET | Freq: Every day | ORAL | Status: DC
  Filled 2014-09-13 (×2): qty 1

## 2014-09-13 MED ORDER — HEPARIN (PORCINE) IN NACL 100-0.45 UNIT/ML-% IJ SOLN
850.0000 [IU]/h | INTRAMUSCULAR | Status: DC
  Filled 2014-09-13: qty 250

## 2014-09-13 MED ORDER — VANCOMYCIN HCL IN DEXTROSE 750-5 MG/150ML-% IV SOLN
750.0000 mg | Freq: Three times a day (TID) | INTRAVENOUS | Status: DC
  Administered 2014-09-13 (×2): 750 mg via INTRAVENOUS
  Filled 2014-09-13 (×3): qty 150

## 2014-09-13 MED ORDER — VITAMIN D3 25 MCG (1000 UNIT) PO TABS
1000.0000 [IU] | ORAL_TABLET | Freq: Every day | ORAL | Status: DC
  Administered 2014-09-13: 1000 [IU] via ORAL
  Filled 2014-09-13 (×2): qty 1

## 2014-09-13 MED ORDER — LACTATED RINGERS IV SOLN
INTRAVENOUS | Status: DC
  Administered 2014-09-13: 11:00:00 via INTRAVENOUS
  Filled 2014-09-13: qty 1000

## 2014-09-13 MED ORDER — SODIUM CHLORIDE 0.9 % IV BOLUS (SEPSIS)
1000.0000 mL | Freq: Once | INTRAVENOUS | Status: AC
  Administered 2014-09-13: 1000 mL via INTRAVENOUS

## 2014-09-13 MED ORDER — ATORVASTATIN CALCIUM 40 MG PO TABS
40.0000 mg | ORAL_TABLET | Freq: Every day | ORAL | Status: DC
  Filled 2014-09-13 (×2): qty 1

## 2014-09-13 MED ORDER — LEVALBUTEROL HCL 1.25 MG/0.5ML IN NEBU
1.2500 mg | INHALATION_SOLUTION | Freq: Four times a day (QID) | RESPIRATORY_TRACT | Status: DC
  Administered 2014-09-13: 1.25 mg via RESPIRATORY_TRACT
  Filled 2014-09-13 (×6): qty 0.5

## 2014-09-13 MED FILL — Medication: Qty: 1 | Status: AC

## 2014-09-13 NOTE — Progress Notes (Signed)
ANTICOAGULATION CONSULT NOTE - Follow-up Consult  Pharmacy Consult for heparin Indication: pulmonary embolus  No Known Allergies  Patient Measurements: Height:  (177.8 cm) Weight: 164 lb 14.5 oz (74.8 kg) IBW/kg (Calculated) : 73 Heparin Dosing Weight: 75  Vital Signs: Temp: 98.5 F (36.9 C) (07/17 0000) Temp Source: Oral (07/17 0000) BP: 107/75 mmHg (07/17 0329) Pulse Rate: 94 (07/17 0329)  Labs:  Recent Labs  09/27/2014 1532 2014-09-27 1559 September 27, 2014 1927 09/13/14 0200  HGB 15.6 17.3*  --   --   HCT 48.3 51.0  --   --   PLT 169  --   --   --   APTT  --   --  38*  --   LABPROT  --   --  16.8*  --   INR  --   --  1.35  --   HEPARINUNFRC  --   --   --  1.02*  CREATININE 1.20 0.90 1.46*  --     Estimated Creatinine Clearance: 59 mL/min (by C-G formula based on Cr of 1.46).  Assessment: 54 year old male s/p PEA arrest. Pt on heparin for acute submassive PE. Heparin level 1.02 (supratherapeutic) on 1250 units/hr. No issues with bleeding or line per RN.  Goal of Therapy:  Heparin level 0.3-0.7 units/ml Monitor platelets by anticoagulation protocol: Yes   Plan:  -Hold heparin x 1 hour -Restart heparin at 1050 units/hr -F/u 6 he heparin level post restart  Christoper Fabian, PharmD, BCPS Clinical pharmacist, pager [phone] 09/13/2014 3:56 AM

## 2014-09-13 NOTE — Progress Notes (Signed)
eLink Physician-Brief Progress Note Patient Name: Mario Shields DOB: 1960-07-08 MRN: [id number]   Date of Service  09/13/2014  HPI/Events of Note  Blood Cultures from 09/16/2014 are positive for GPC's in clusters.   eICU Interventions  Will order Vancomycin dosed per Pharmacy.     Intervention Category Major Interventions: Infection - evaluation and management  Trust Crago Eugene 09/13/2014, 3:11 PM

## 2014-09-13 NOTE — Progress Notes (Signed)
100 cc of Fentanyl wasted in sink witness by 2 RN's, [employer] and [employer]

## 2014-09-13 NOTE — Progress Notes (Signed)
CRITICAL VALUE ALERT  Critical value received:  Blood Cultures-1 bottle has gram + cocci  Date of notification:  09/13/2014  Time of notification:  1500  Critical value read back:Yes.    Nurse who received alert:  Wilfred Curtis   E-Link RN/MD notified.

## 2014-09-13 NOTE — Progress Notes (Signed)
VASCULAR LAB PRELIMINARY  PRELIMINARY  PRELIMINARY  PRELIMINARY  Bilateral lower extremity venous Doppler completed.    Preliminary report:  There is non occlusive DVT noted in the left popliteal vein.   Abella Shugart, RVT 09/13/2014, 11:54 AM

## 2014-09-13 NOTE — Progress Notes (Signed)
eLink Physician-Brief Progress Note Patient Name: Mario Shields DOB: November 25, 1960 MRN: [id number]   Date of Service  09/13/2014  HPI/Events of Note  Patient c/o sore throat.   eICU Interventions  Will order Cepacol Lozenge PRN.     Intervention Category Intermediate Interventions: Other:  Lenell Antu 09/13/2014, 4:37 PM

## 2014-09-13 NOTE — Progress Notes (Signed)
ANTICOAGULATION CONSULT NOTE - Follow Up Consult  Pharmacy Consult for heparin Indication: pulmonary embolus Patient Measurements: Height: 5\' 10"  (177.8 cm) Weight: 169 lb 8.5 oz (76.9 kg) IBW/kg (Calculated) : 73 Heparin Dosing Weight: 75 kg Labs:  Recent Labs  2014-09-29 1532 09/29/14 1559 Sep 29, 2014 1927 09/13/14 0200 09/13/14 0550 09/13/14 1245 09/13/14 2113  HGB 15.6 17.3*  --   --  14.5  --   --   HCT 48.3 51.0  --   --  42.6  --   --   PLT 169  --   --   --  157  --   --   APTT  --   --  38*  --   --   --   --   LABPROT  --   --  16.8*  --   --   --   --   INR  --   --  1.35  --   --   --   --   HEPARINUNFRC  --   --   --  1.02*  --  0.95* 0.42  CREATININE 1.20 0.90 1.46*  --  1.01  --   --     Estimated Creatinine Clearance: 85.3 mL/min (by C-G formula based on Cr of 1.01).  Assessment: 54 yo m s/p PEA arrest.  Pt is on heparin for acute PE.  HL still supratherapeutic at 0.95 today.  No issues or bleeding per RN.  Hgb 14.5, plts 157.  Follow up HL at goal (0.4) no issues noted, follow up with am labs  Goal of Therapy:  Heparin level 0.3-0.7 units/ml Monitor platelets by anticoagulation protocol: Yes   Plan:  Continue heparin infusion at 850 units/hr  Daily HL/CBC  Sheppard Coil PharmD., BCPS Clinical Pharmacist Pager [phone] 09/13/2014 9:58 PM

## 2014-09-13 NOTE — Progress Notes (Addendum)
eLink Physician-Brief Progress Note Patient Name: Mario Shields DOB: June 01, 1960 MRN: [id number]   Date of Service  09/13/2014  HPI/Events of Note  Agitation. Sat = 97%. BP = 151/105. RR = 30. HR = 130 (Sinus Tachycardia).  eICU Interventions  Will order: 1. Check blood glucose. 2. Xopenex Nebs now Q 6 hours.  3. BiPAP  4. Check ABG at 11:45 PM.  5. Lasix 20 mg IV now.     Intervention Category Intermediate Interventions: Respiratory distress - evaluation and management Minor Interventions: Agitation / anxiety - evaluation and management  Sommer,Steven Eugene 09/13/2014, 10:49 PM

## 2014-09-13 NOTE — Progress Notes (Signed)
Placed pt on bipap per md order.

## 2014-09-13 NOTE — Progress Notes (Signed)
Utilization Review Completed.Clif Serio T7/17/2016  

## 2014-09-13 NOTE — Progress Notes (Signed)
eLink Physician-Brief Progress Note Patient Name: CANDIDO FLOTT DOB: 10/23/1960 MRN: [id number]   Date of Service  09/13/2014  HPI/Events of Note  Hypotension - BP = 80/48.  eICU Interventions  Will bolus with 0.9 NaCl 1 liter IV over 1 hour now.      Intervention Category Major Interventions: Hypotension - evaluation and management  Saiya Crist Eugene 09/13/2014, 6:09 PM

## 2014-09-13 NOTE — Progress Notes (Signed)
Changed vent mode to PSV 10/5 and 40% patient tolerated well SATS 98%, RR 15, HR 85, BP 116/81, will continue to monitor patient.

## 2014-09-13 NOTE — Progress Notes (Signed)
CRITICAL VALUE ALERT  Critical value received:  Lactic acid 2.8  Date of notification: 09/13/14  Time of notification:  0655  Critical value read back yes  Nurse who received alert: Rivka Spring RN   Value is an expected find. Lactic Acid trending down from 16.6

## 2014-09-13 NOTE — Progress Notes (Signed)
Patient extubated per MD's order placed on 4LNC SATS 97%, RR 16, BBS clear no stridor heard at the patient's neck, patient able to vocalize, will continue to monitor patient.

## 2014-09-13 NOTE — Consult Note (Signed)
Chief Complaint: Chief Complaint  Patient presents with  . Emesis  . Shortness of Breath  . Loss of Consciousness    Referring Physician(s): Dr. Molli Knock - PCCM  History of Present Illness: Mario Shields is a 54 y.o. male with multiple sclerosis and a history of chronic right sided weakness.  History obtained by chart review and discussion with his brother over the phone.  Pt currently intubated and unable to communicate.     He presented to the ER earlier today after experiencing sudden onset of seizure-like activity lasting approximately 30 seconds followed by decreased mental status and vomiting.  He was awake and talking upon arrival to the ER but then became bradycardiac and progressed to PEA arrest which responded within 3 minutes of CPR and 2 rounds of epinephrine.  EKG shows new RBBB and CTA chest shows large volume PE (nearly all in the right PA) and right heart strain with RV/LV of nearly 1.9.  ECHO also shows evidence of right heart strain and he has a minimally elevated troponin consistent with demand ischemia.    sPESI is 2 (HR > 110 and BP < 100) indicating high risk for mortality.   Pt intubated, ventilated, on IV heparin and showing improvement in acidosis, heart rate and maintaining BP with fluids. He sedated mildly with IV fentanyl but does respond to commands.   Past Medical History  Diagnosis Date  . MS (multiple sclerosis)   . Hypertension   . Hyperlipidemia   . Anemia   . Type II diabetes mellitus   . GERD (gastroesophageal reflux disease)   . Depression   . Dysphagia 05/2014  . Slurred speech 05/2014  . Right sided weakness 05/2014    Past Surgical History  Procedure Laterality Date  . Appendectomy      Allergies: Review of patient's allergies indicates no known allergies.  Medications: Prior to Admission medications   Medication Sig Start Date End Date Taking? Authorizing Provider  aspirin EC 81 MG tablet Take 81 mg by mouth daily.    Historical  Provider, MD  atorvastatin (LIPITOR) 40 MG tablet Take 40 mg by mouth at bedtime.    Historical Provider, MD  BAYER CONTOUR TEST test strip 1 each by Other route as needed.  05/17/12   Historical Provider, MD  buPROPion (WELLBUTRIN SR) 150 MG 12 hr tablet Take 150 mg by mouth 2 (two) times daily.    Historical Provider, MD  cholecalciferol (VITAMIN D) 1000 UNITS tablet Take 1,000 Units by mouth daily.    Historical Provider, MD  Dimethyl Fumarate (TECFIDERA) 240 MG CPDR Take 1 capsule (240 mg total) by mouth 2 (two) times daily. 06/25/13   Suanne Marker, MD  docusate sodium (COLACE) 100 MG capsule Take 1 capsule (100 mg total) by mouth 2 (two) times daily. 06/07/14   Marrian Salvage, MD  insulin glargine (LANTUS) 100 UNIT/ML injection Inject 30 Units into the skin daily. Patient taking differently: Inject 50 Units into the skin daily.  04/19/12   Jarome Matin, MD  metFORMIN (GLUCOPHAGE-XR) 500 MG 24 hr tablet Take 1,000 mg by mouth daily with breakfast.    Historical Provider, MD  Multiple Vitamin (MULTIVITAMIN WITH MINERALS) TABS Take 1 tablet by mouth daily.    Historical Provider, MD  pantoprazole (PROTONIX) 40 MG tablet Take 1 tablet (40 mg total) by mouth daily at 6 (six) AM. 04/19/12   Jarome Matin, MD  valsartan (DIOVAN) 80 MG tablet Take 1 tablet (80 mg total)  by mouth daily. 06/08/14   Marrian Salvage, MD     Family History  Problem Relation Age of Onset  . Cancer Father   . Diabetes    . Hypertension    . Heart disease    . Cancer      History   Social History  . Marital Status: Single    Spouse Name: N/A  . Number of Children: N/A  . Years of Education: N/A   Social History Main Topics  . Smoking status: Current Every Day Smoker -- 0.50 packs/day for 40 years    Types: Cigarettes  . Smokeless tobacco: Never Used  . Alcohol Use: No  . Drug Use: No  . Sexual Activity: No   Other Topics Concern  . None   Social History Narrative   Lives at home with brother  and sister who take care of him. [facility].     Review of Systems: Unable to obtain secondary to intubation  Review of Systems  Vital Signs: BP 102/76 mmHg  Pulse 110  Temp(Src) 98.4 F (36.9 C) (Oral)  Resp 22  Ht 5\' 10"  (1.778 m)  Wt 164 lb 14.5 oz (74.8 kg)  BMI 23.66 kg/m2  SpO2 100%  Physical Exam  Constitutional: He appears well-developed and well-nourished. No distress. He is intubated.  HENT:  Head: Normocephalic and atraumatic.  Intubated   Eyes: No scleral icterus.  Cardiovascular: Normal heart sounds.  Tachycardia present.   Pulmonary/Chest: Breath sounds normal. He is intubated.  Abdominal: Soft. He exhibits no distension.    Imaging: Ct Head Wo Contrast  09/07/2014   CLINICAL DATA:  Recent syncopal episode  EXAM: CT HEAD WITHOUT CONTRAST  TECHNIQUE: Contiguous axial images were obtained from the base of the skull through the vertex without intravenous contrast.  COMPARISON:  06/03/2014  FINDINGS: Bony calvarium is intact. Mild atrophic changes and chronic white matter ischemic change is again identified. An old lacunar infarct is noted within the pons on the right. No acute hemorrhage, acute infarction or space-occupying mass lesion is identified.  IMPRESSION: Chronic ischemic changes and atrophic changes. No acute abnormality noted.   Electronically Signed   By: Alcide Clever M.D.   On: 08/31/2014 18:30   Ct Angio Chest Pe W/cm &/or Wo Cm  09/05/2014   CLINICAL DATA:  Recent syncopal episode with subsequent bradycardia and unresponsiveness  EXAM: CT ANGIOGRAPHY CHEST WITH CONTRAST  TECHNIQUE: Multidetector CT imaging of the chest was performed using the standard protocol during bolus administration of intravenous contrast. Multiplanar CT image reconstructions and MIPs were obtained to evaluate the vascular anatomy.  CONTRAST:  OMNIPAQUE IOHEXOL 350 MG/ML SOLN  COMPARISON:  None.  FINDINGS: Lungs are well aerated bilaterally. Mild left lower  lobe atelectasis is seen.  An endotracheal tube and nasogastric catheter are noted in satisfactory position. Thoracic inlet is otherwise within normal limits. The thoracic aorta shows no aneurysmal dilatation. Pulmonary artery is well visualized and demonstrates multiple filling defects consistent with pulmonary emboli. These are more prominent particularly in the right lower lobe branches.  No hilar or mediastinal adenopathy is noted. The visualized upper abdomen is within normal limits. No acute bony abnormality is noted.  Review of the MIP images confirms the above findings.  IMPRESSION: Positive for acute PE with CT evidence of right heart strain (RV/LV Ratio = 1.89) consistent with at least submassive (intermediate risk) PE. The presence of right heart strain has been associated with an increased  risk of morbidity and mortality. Please activate Code PE by paging [phone].  These results were called by telephone at the time of interpretation on 2014/09/21 at 6:48 pm to Scarlette Calico, the pts nurse, who verbally acknowledged these results and will contact the primary physician   Electronically Signed   By: Alcide Clever M.D.   On: 21-Sep-2014 18:49   Ct Abdomen Pelvis W Contrast  September 21, 2014   CLINICAL DATA:  Syncope, nausea and vomiting  EXAM: CT ABDOMEN AND PELVIS WITH CONTRAST  TECHNIQUE: Multidetector CT imaging of the abdomen and pelvis was performed using the standard protocol following bolus administration of intravenous contrast.  CONTRAST:  OMNIPAQUE IOHEXOL 350 MG/ML SOLN  COMPARISON:  None.  FINDINGS: Lung bases shows small atelectasis left base posteriorly. Sagittal images of the spine are unremarkable. NG tube partially visualized.  The gallbladder is contracted. There is subtle mild enhancement and mild thickening of gallbladder wall without pericholecystic fluid. Further correlation with gallbladder ultrasound is suggested to exclude gallbladder wall inflammation.  The pancreas, spleen and  adrenal glands are unremarkable. Atherosclerotic plaques and calcifications are noted within abdominal aorta and iliac arteries. No aortic aneurysm.  Kidneys are symmetrical in size and enhancement. No hydronephrosis or hydroureter. There is a cyst in upper pole of the left kidney measures she 3.6 cm.  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  Moderate stool noted in right colon and transverse colon. There is no pericecal inflammation. The terminal ileum is unremarkable. The patient is status post appendectomy. There is abundant stool in distal sigmoid colon and rectum. The rectum is markedly distended with stool measures at least 7.6 cm in diameter highly suspicious for fecal impaction.  Delayed renal images shows bilateral renal symmetrical excretion. There is a Foley catheter in a decompressed urinary bladder. There is thickening of urinary bladder wall. Cystitis or bladder inflammation cannot be excluded.  IMPRESSION: 1. There is NG tube in place with tip in distal stomach. 2. There is a contracted gallbladder. Minimal enhancement and thickening of gallbladder wall. Although this may be due to collapsed wall inflammation cannot be completely excluded. Further correlation with gallbladder ultrasound is suggested as clinically warranted. 3. 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 4. No hydronephrosis or hydroureter. 5. There is abundant stool in distal sigmoid colon and rectum. The rectum measures at least 7.6 cm in diameter highly suspicious for fecal impaction. 6. There is a Foley catheter in decompressed urinary bladder. Thickening of urinary bladder wall. Cystitis or chronic bladder inflammation cannot be excluded. Clinical correlation is necessary.   Electronically Signed   By: Natasha Mead M.D.   On: 09-21-14 18:51   Dg Chest Portable 1 View  21-Sep-2014   CLINICAL DATA:  ETT placement  EXAM: PORTABLE CHEST - 1 VIEW  COMPARISON:  06/05/2014  FINDINGS:  Lungs are clear.  No pleural effusion or pneumothorax.  The heart is top-normal in size.  Endotracheal tube terminates 6 cm above the carina.  Defibrillator pads overlying the left hemithorax.  IMPRESSION: Endotracheal tube terminates 6 cm above the carina.  No evidence of acute cardiopulmonary disease.   Electronically Signed   By: Charline Bills M.D.   On: 09/21/14 16:16   Dg Abd Portable 1v  09-21-2014   CLINICAL DATA:  Check nasogastric catheter placement  EXAM: PORTABLE ABDOMEN - 1 VIEW  COMPARISON:  None.  FINDINGS: Scattered large and small bowel gas is noted. A nasogastric catheter is  noted within the stomach. No acute bony abnormality is seen.  IMPRESSION: Nasogastric catheter within the stomach.   Electronically Signed   By: Alcide Clever M.D.   On: 09/27/2014 16:19    Labs:  CBC:  Recent Labs  06/05/14 0447 06/06/14 0502 06/07/14 0753 09/25/2014 1532 09/21/2014 1559  WBC 23.4* 16.7* 11.1* 25.0*  --   HGB 13.4 12.4* 12.7* 15.6 17.3*  HCT 38.7* 36.2* 37.6* 48.3 51.0  PLT 181 180 192 169  --     COAGS:  Recent Labs  08/30/2014 1927  INR 1.35  APTT 38*    BMP:  Recent Labs  06/06/14 0502 06/07/14 0753 09/05/2014 1532 09/26/2014 1559 09/27/2014 1927  NA 138 140 139 139 139  K 3.4* 4.2 3.2* 3.0* 5.3*  CL 107 110 102 104 107  CO2 22 18* 14*  --  17*  GLUCOSE 121* 123* 341* 333* 300*  BUN CALCIUM 8.9 9.4 10.2  --  8.8*  CREATININE 0.76 0.65 1.20 0.90 1.46*  GFRNONAA >90 >90 >60  --  52*  GFRAA >90 >90 >60  --  >60    LIVER FUNCTION TESTS:  Recent Labs  06/03/14 1631 09/20/2014 1532  BILITOT 1.0 0.3  AST 22 28  ALT 37 35  ALKPHOS 116 108  PROT 7.8 6.4*  ALBUMIN 4.4 3.7    TUMOR MARKERS: No results for input(s): AFPTM, CEA, CA199, CHROMGRNA in the last 8760 hours.  Assessment and Plan:  Unfortunate 54 year old gentleman with massive PE (bulk of thrombus in right pulmonary artery) resulting in right heart strain and PEA arrest.  Right heart  strain by CTA, ECHO and + troponin.  sPESI is 2 indicating high risk.  Unfortunately this gentleman has known multiple sclerosis with chronic right sided weakness and evidence of progression of the chronic demyelinating process on brain MRI from April 2016.  He is likely at some increased risk for possible intracranial hemorrhage due to the underlying demyelinating disease and disruption of the blood brain barrier.  Furthermore, he is intubated and sedated which makes monitoring his neurological status difficult.      He has shown significant improvement since admission, intubation, anticoagulation with heparin and fluid bolus.  Acidosis has improved, blood pressure maintaining with only a transient dip of the SBP into the 90s around 20:00 and tachycardia is improving from high 120s to 100-110.  Given his improvement thus far, my recommendation is to proceed cautiously with regard to ultrasound accelerated catheter directed lysis (USAT with EKOS).  We will continue current care and re-assess in the morning.  If his improvement plateaus or if there is any deterioration then I think proceeding with lysis can be considered as the risk of intracranial hemorrhage would be less than the benefit of offloading his right heart.  However, if he continues to show improvement, then I would continue to defer lysis given his elevated risk.   I have discussed this with his brother who is in agreement with the plan and also with Dr. Molli Knock of Critical Care.    Thank you for this interesting consult.  I greatly enjoyed meeting Mario Shields and look forward to participating in their care.  SignedMalachy Moan 09/13/2014, 12:40 AM   I spent a total of 20 Minutes in face to face in clinical consultation, greater than 50% of which was counseling/coordinating care for massive pulmonary embolus.

## 2014-09-13 NOTE — Progress Notes (Addendum)
PULMONARY / CRITICAL CARE MEDICINE   Name: Mario Shields MRN: [id number] DOB: March 09, 1960    ADMISSION DATE:  10-11-2014 CONSULTATION DATE:  10-11-2014  REFERRING MD :  Dr. Clarene Duke   CHIEF COMPLAINT:  PEA Arrest  INITIAL PRESENTATION:  54 yo male presented with n/v, syncope, acidosis and brief PEA arrest from acute PE.  Hx of MS with Rt sided weakness and chair bound.  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  PEA arrest while in ER.  Intubated.  .  7/17  IR consulted to assess for Ekos, extubated  SUBJECTIVE:  Denies chest pain.  Wants ETT out.  VITAL SIGNS: Temp:  [97.9 F (36.6 C)-98.7 F (37.1 C)] 98.7 F (37.1 C) (07/17 0800) Pulse Rate:  [61-142] 83 (07/17 0800) Resp:  [14-36] 14 (07/17 0800) BP: (95-187)/(51-92) 116/79 mmHg (07/17 0800) SpO2:  [95 %-100 %] 99 % (07/17 0800) FiO2 (%):  [40 %-100 %] 40 % (07/17 0730) Weight:  [164 lb 14.5 oz (74.8 kg)-169 lb 8.5 oz (76.9 kg)] 169 lb 8.5 oz (76.9 kg) (07/17 0456)   VENTILATOR SETTINGS: Vent Mode:  [-] PSV FiO2 (%):  [40 %-100 %] 40 % Set Rate:  [14 bmp-16 bmp] 16 bmp Vt Set:  [590 mL] 590 mL PEEP:  [5 cmH20] 5 cmH20 Pressure Support:  [10 cmH20] 10 cmH20 Plateau Pressure:  [15 cmH20-22 cmH20] 16 cmH20   INTAKE / OUTPUT:  Intake/Output Summary (Last 24 hours) at 09/13/14 0819 Last data filed at 09/13/14 0800  Gross per 24 hour  Intake 3684.83 ml  Output   1925 ml  Net 1759.83 ml    PHYSICAL EXAMINATION: General: pleasant Neuro: alert, follows commands HEENT: ETT in place Cardiovascular: regular Lungs: no wheeze Abdomen: soft, non tender Musculoskeletal:  No edema Skin: no rashes  LABS:  CBC  Recent Labs Lab 2014/10/11 1532  10-11-2014 1559 09/13/14 0550  WBC 25.0*  --  26.1*  HGB 15.6 17.3* 14.5  HCT 48.3 51.0 42.6  PLT 169  --  157   Coag's  Recent Labs Lab Oct 11, 2014 1927  APTT 38*  INR 1.35     BMET  Recent Labs Lab 10/11/14 1532 10/11/14 1559 October 11, 2014 1927 09/13/14 0550  NA 139 139 139 142  K 3.2* 3.0* 5.3* 3.6  CL 102 104 107 109  CO2 14*  --  17* 22  BUN CREATININE 1.20 0.90 1.46* 1.01  GLUCOSE 341* 333* 300* 173*   Electrolytes  Recent Labs Lab Oct 11, 2014 1532 October 11, 2014 1927 09/13/14 0550  CALCIUM 10.2 8.8* 8.1*  MG 2.7* 2.5* 2.0  PHOS  --  5.3* 4.3     Sepsis Markers  Recent Labs Lab Oct 11, 2014 1602 October 11, 2014 1755 09/13/14 0550  LATICACIDVEN 16.40* 10.8* 2.8*   ABG  Recent Labs Lab October 11, 2014 1736 2014/10/11 2132 09/13/14 0339  PHART 7.164* 7.341* 7.357  PCO2ART 36.5 33.4* 35.9  PO2ART 240.0* 163.0* 141.0*     Liver Enzymes  Recent Labs Lab 10/11/2014 1532  AST 28  ALT 35  ALKPHOS 108  BILITOT 0.3  ALBUMIN 3.7    Imaging Ct Head Wo Contrast  October 11, 2014   CLINICAL DATA:  Recent  syncopal episode  EXAM: CT HEAD WITHOUT CONTRAST  TECHNIQUE: Contiguous axial images were obtained from the base of the skull through the vertex without intravenous contrast.  COMPARISON:  06/03/2014  FINDINGS: Bony calvarium is intact. Mild atrophic changes and chronic white matter ischemic change is again identified. An old lacunar infarct is noted within the pons on the right. No acute hemorrhage, acute infarction or space-occupying mass lesion is identified.  IMPRESSION: Chronic ischemic changes and atrophic changes. No acute abnormality noted.   Electronically Signed   By: Alcide Clever M.D.   On: 09/29/14 18:30   Ct Angio Chest Pe W/cm &/or Wo Cm  09-29-2014   CLINICAL DATA:  Recent syncopal episode with subsequent bradycardia and unresponsiveness  EXAM: CT ANGIOGRAPHY CHEST WITH CONTRAST  TECHNIQUE: Multidetector CT imaging of the chest was performed using the standard  protocol during bolus administration of intravenous contrast. Multiplanar CT image reconstructions and MIPs were obtained to evaluate the vascular anatomy.  CONTRAST:  OMNIPAQUE IOHEXOL 350 MG/ML SOLN  COMPARISON:  None.  FINDINGS: Lungs are well aerated bilaterally. Mild left lower lobe atelectasis is seen.  An endotracheal tube and nasogastric catheter are noted in satisfactory position. Thoracic inlet is otherwise within normal limits. The thoracic aorta shows no aneurysmal dilatation. Pulmonary artery is well visualized and demonstrates multiple filling defects consistent with pulmonary emboli. These are more prominent particularly in the right lower lobe branches.  No hilar or mediastinal adenopathy is noted. The visualized upper abdomen is within normal limits. No acute bony abnormality is noted.  Review of the MIP images confirms the above findings.  IMPRESSION: Positive for acute PE with CT evidence of right heart strain (RV/LV Ratio = 1.89) consistent with at least submassive (intermediate risk) PE. The presence of right heart strain has been associated with an increased risk of morbidity and mortality. Please activate Code PE by paging [phone].  These results were called by telephone at the time of interpretation on 2014-09-29 at 6:48 pm to Scarlette Calico, the pts nurse, who verbally acknowledged these results and will contact the primary physician   Electronically Signed   By: Alcide Clever M.D.   On: 09-29-2014 18:49   Ct Abdomen Pelvis W Contrast  09/29/2014   CLINICAL DATA:  Syncope, nausea and vomiting  EXAM: CT ABDOMEN AND PELVIS WITH CONTRAST  TECHNIQUE: Multidetector CT imaging of the abdomen and pelvis was performed using the standard protocol following bolus administration of intravenous contrast.  CONTRAST:  OMNIPAQUE IOHEXOL 350 MG/ML SOLN  COMPARISON:  None.  FINDINGS: Lung bases shows small atelectasis left base posteriorly. Sagittal images of the spine are unremarkable. NG tube  partially visualized.  The gallbladder is contracted. There is subtle mild enhancement and mild thickening of gallbladder wall without pericholecystic fluid. Further correlation with gallbladder ultrasound is suggested to exclude gallbladder wall inflammation.  The pancreas, spleen and adrenal glands are unremarkable. Atherosclerotic plaques and calcifications are noted within abdominal aorta and iliac arteries. No aortic aneurysm.  Kidneys are symmetrical in size and enhancement. No hydronephrosis or hydroureter. There is a cyst in upper pole of the left kidney measures she 3.6 cm.  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  Moderate stool noted in right colon and transverse colon. There is no pericecal inflammation. The terminal ileum is unremarkable. The patient is status post appendectomy. There is abundant stool in distal sigmoid colon and rectum. The rectum is markedly distended with stool measures at least 7.6 cm  in diameter highly suspicious for fecal impaction.  Delayed renal images shows bilateral renal symmetrical excretion. There is a Foley catheter in a decompressed urinary bladder. There is thickening of urinary bladder wall. Cystitis or bladder inflammation cannot be excluded.  IMPRESSION: 1. There is NG tube in place with tip in distal stomach. 2. There is a contracted gallbladder. Minimal enhancement and thickening of gallbladder wall. Although this may be due to collapsed wall inflammation cannot be completely excluded. Further correlation with gallbladder ultrasound is suggested as clinically warranted. 3. 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 4. No hydronephrosis or hydroureter. 5. There is abundant stool in distal sigmoid colon and rectum. The rectum measures at least 7.6 cm in diameter highly suspicious for fecal impaction. 6. There is a Foley catheter in decompressed urinary bladder. Thickening of urinary bladder wall. Cystitis or chronic  bladder inflammation cannot be excluded. Clinical correlation is necessary.   Electronically Signed   By: Natasha Mead M.D.   On: 09/13/2014 18:51   Dg Chest Portable 1 View  09/20/2014   CLINICAL DATA:  ETT placement  EXAM: PORTABLE CHEST - 1 VIEW  COMPARISON:  06/05/2014  FINDINGS: Lungs are clear.  No pleural effusion or pneumothorax.  The heart is top-normal in size.  Endotracheal tube terminates 6 cm above the carina.  Defibrillator pads overlying the left hemithorax.  IMPRESSION: Endotracheal tube terminates 6 cm above the carina.  No evidence of acute cardiopulmonary disease.   Electronically Signed   By: Charline Bills M.D.   On: 09/21/2014 16:16   Dg Abd Portable 1v  09/13/2014   CLINICAL DATA:  Check nasogastric catheter placement  EXAM: PORTABLE ABDOMEN - 1 VIEW  COMPARISON:  None.  FINDINGS: Scattered large and small bowel gas is noted. A nasogastric catheter is noted within the stomach. No acute bony abnormality is seen.  IMPRESSION: Nasogastric catheter within the stomach.   Electronically Signed   By: Alcide Clever M.D.   On: 09/24/2014 16:19     ASSESSMENT / PLAN:  PULMONARY ETT 7/16 >>  A: Acute respiratory failure 2nd to acute PE and acidosis. Tobacco abuse. P:   Proceed with extubation 7/17 Heparin gtt per pharmacy >> likely transition to oral anticoagulant on 7/18 Agree with deferring EKOS F/u doppler legs Duoneb PRN   CARDIOVASCULAR A:  PEA, RBBB 2nd to acute PE. Hx HTN, HLD. P:  Resume ASA, lipitor Hold diovan  RENAL A:   Metabolic Acidosis / Lactic Acidosis - 16.40 on admit, on metformin PTA >> much improved. Hypokalemia. P:   D/c HCO3 from IV fluid Monitor renal fx, urine outpt  GASTROINTESTINAL A:   Nausea / Vomiting >> resolved. GERD.  Dysphagia.  Constipation. P:   Advance diet after extubation Pepcid for SUP Adjust bowel regimen  HEMATOLOGIC A:   Leukocytosis. P:  F/u CBC  INFECTIOUS A:   No evidence for infection. P:   Monitor  clinically  Blood 7/16 >> Urine 7/16 >> Sputum 7/16 >>   ENDOCRINE A:   DM II. P:   SSI  Hold home metformin  NEUROLOGIC A:   Hx of depression, multiple sclerosis with Rt sided weakness.   P:   Resume wellbutrin   Coralyn Helling, MD [facility] Pulmonary/Critical Care 09/13/2014, 8:19 AM Pager:  [phone] After 3pm call: [phone]

## 2014-09-13 NOTE — Progress Notes (Signed)
eLink Physician-Brief Progress Note Patient Name: Mario Shields DOB: [birth year] MRN: [id number]   Date of Service  09/13/2014  HPI/Events of Note  Hypotensive again. BP = 79/44.  eICU Interventions  Will bolus with 0.9 NaCl 1 liter IV over 1 hour now.      Intervention Category Major Interventions: Hypotension - evaluation and management  Carmelo Reidel Eugene 09/13/2014, 9:06 PM

## 2014-09-13 NOTE — Progress Notes (Signed)
CRITICAL VALUE ALERT  Critical value received:  Lactic Acid   Date of notification:  September 22, 2014  Time of notification:  2000  Critical value read back:yes  Nurse who received alert:  Rivka Spring RN   [facility] RN notified of lab value this is an expected find from previous Lactic Acid result.

## 2014-09-13 NOTE — Progress Notes (Signed)
ANTICOAGULATION CONSULT NOTE - Follow Up Consult  Pharmacy Consult for heparin Indication: pulmonary embolus  No Known Allergies  Patient Measurements: Height:  (177.8 cm) Weight: 169 lb 8.5 oz (76.9 kg) IBW/kg (Calculated) : 73 Heparin Dosing Weight: 75 kg  Vital Signs: Temp: 98 F (36.7 C) (07/17 1200) Temp Source: Oral (07/17 1200) BP: 136/80 mmHg (07/17 1300) Pulse Rate: 95 (07/17 1300)  Labs:  Recent Labs  09/23/2014 1532 09/19/2014 1559 09/11/2014 1927 09/13/14 0200 09/13/14 0550 09/13/14 1245  HGB 15.6 17.3*  --   --  14.5  --   HCT 48.3 51.0  --   --  42.6  --   PLT 169  --   --   --  157  --   APTT  --   --  38*  --   --   --   LABPROT  --   --  16.8*  --   --   --   INR  --   --  1.35  --   --   --   HEPARINUNFRC  --   --   --  1.02*  --  0.95*  CREATININE 1.20 0.90 1.46*  --  1.01  --     Estimated Creatinine Clearance: 85.3 mL/min (by C-G formula based on Cr of 1.01).  Assessment: 54 yo m s/p PEA arrest.  Pt is on heparin for acute PE.  HL still supratherapeutic at 0.95 today.  No issues or bleeding per RN.  Hgb 14.5, plts 157.  Goal of Therapy:  Heparin level 0.3-0.7 units/ml Monitor platelets by anticoagulation protocol: Yes   Plan:  Hold heparin x 1 hour Restart heparin infusion at 850 units/hr at 1530 6-hr HL @ 2130  Stewart, Cassie L 09/13/2014,2:18 PM

## 2014-09-13 NOTE — Progress Notes (Signed)
ANTIBIOTIC CONSULT NOTE - INITIAL  Pharmacy Consult for vancomycin Indication: GPC in 1/2 blood cultures  No Known Allergies  Patient Measurements: Height:  (177.8 cm) Weight: 169 lb 8.5 oz (76.9 kg) IBW/kg (Calculated) : 73  Vital Signs: Temp: 98 F (36.7 C) (07/17 1200) Temp Source: Oral (07/17 1200) BP: 133/66 mmHg (07/17 1400) Pulse Rate: 105 (07/17 1500) Intake/Output from previous day: 07/16 0701 - 07/17 0700 In: 3562 [I.V.:3462; NG/GT:100] Out: 1700 [Urine:1200; Emesis/NG output:500] Intake/Output from this shift: Total I/O In: 1051.7 [P.O.:360; I.V.:691.7] Out: 880 [Urine:880]  Labs:  Recent Labs  28-Sep-2014 1532 September 28, 2014 1559 09/28/14 1927 09/13/14 0550  WBC 25.0*  --   --  26.1*  HGB 15.6 17.3*  --  14.5  PLT 169  --   --  157  CREATININE 1.20 0.90 1.46* 1.01   Estimated Creatinine Clearance: 85.3 mL/min (by C-G formula based on Cr of 1.01). No results for input(s): VANCOTROUGH, VANCOPEAK, VANCORANDOM, GENTTROUGH, GENTPEAK, GENTRANDOM, TOBRATROUGH, TOBRAPEAK, TOBRARND, AMIKACINPEAK, AMIKACINTROU, AMIKACIN in the last 72 hours.   Microbiology: Recent Results (from the past 720 hour(s))  Urine culture     Status: None   Collection Time: 2014-09-28  4:52 PM  Result Value Ref Range Status   Specimen Description URINE, RANDOM  Final   Special Requests NONE  Final   Culture NO GROWTH 1 DAY  Final   Report Status 09/13/2014 FINAL  Final  Culture, blood (routine x 2)     Status: None (Preliminary result)   Collection Time: Sep 28, 2014  5:50 PM  Result Value Ref Range Status   Specimen Description BLOOD RIGHT HAND  Final   Special Requests BOTTLES DRAWN AEROBIC ONLY 5CC  Final   Culture NO GROWTH < 24 HOURS  Final   Report Status PENDING  Incomplete  Culture, blood (routine x 2)     Status: None (Preliminary result)   Collection Time: 09/28/2014  5:55 PM  Result Value Ref Range Status   Specimen Description BLOOD RIGHT ANTECUBITAL  Final   Special Requests  BOTTLES DRAWN AEROBIC AND ANAEROBIC 10CC  Final   Culture  Setup Time   Final    GRAM POSITIVE COCCI IN CLUSTERS AEROBIC BOTTLE ONLY CRITICAL RESULT CALLED TO, READ BACK BY AND VERIFIED WITH: J MILFORD,RN AT 1448 09/13/14 BY L BENFIELD    Culture GRAM POSITIVE COCCI  Final   Report Status PENDING  Incomplete  MRSA PCR Screening     Status: None   Collection Time: 09/28/14  6:47 PM  Result Value Ref Range Status   MRSA by PCR NEGATIVE NEGATIVE Final    Comment:        The GeneXpert MRSA Assay (FDA approved for NASAL specimens only), is one component of a comprehensive MRSA colonization surveillance program. It is not intended to diagnose MRSA infection nor to guide or monitor treatment for MRSA infections.     Medical History: Past Medical History  Diagnosis Date  . MS (multiple sclerosis)   . Hypertension   . Hyperlipidemia   . Anemia   . Type II diabetes mellitus   . GERD (gastroesophageal reflux disease)   . Depression   . Dysphagia 05/2014  . Slurred speech 05/2014  . Right sided weakness 05/2014   Assessment: 54 yo m admitted s/p PEA arrest with new PE. Pharmacy is consulted to dose vancomycin for 1/2 GPC in blood cultures.  Wbc 26.1, afebrile, SCr 1.01, CrCl ~85 ml/min.   Vancomycin 7/18 >>  7/16 BCx: 1/2 GPC  7/16 UCx: neg MRSA PCR neg  Goal of Therapy:  Vancomycin trough level 15-20 mcg/ml  Plan:  Vancomycin 750 mg IV q8h F/u ID/sens as likely contaminant Monitor CBC, temperatures, cultures, clinical course  Cigi Bega L. Roseanne Reno, PharmD Clinical Pharmacy Resident Pager: [phone] 09/13/2014 3:20 PM

## 2014-09-14 ENCOUNTER — Inpatient Hospital Stay (HOSPITAL_COMMUNITY): Payer: Medicare (Managed Care) | Admitting: Anesthesiology

## 2014-09-14 DIAGNOSIS — I2699 Other pulmonary embolism without acute cor pulmonale: Secondary | ICD-10-CM

## 2014-09-14 LAB — POCT I-STAT 3, ART BLOOD GAS (G3+)
Acid-base deficit: 8 mmol/L — ABNORMAL HIGH (ref 0.0–2.0)
Bicarbonate: 17.7 mEq/L — ABNORMAL LOW (ref 20.0–24.0)
O2 Saturation: 98 %
Patient temperature: 99
TCO2: 19 mmol/L (ref 0–100)
pCO2 arterial: 37.2 mmHg (ref 35.0–45.0)
pH, Arterial: 7.287 — ABNORMAL LOW (ref 7.350–7.450)
pO2, Arterial: 127 mmHg — ABNORMAL HIGH (ref 80.0–100.0)

## 2014-09-14 LAB — GLUCOSE, CAPILLARY
GLUCOSE-CAPILLARY: 203 mg/dL — AB (ref 65–99)
Glucose-Capillary: 174 mg/dL — ABNORMAL HIGH (ref 65–99)
Glucose-Capillary: 171 mg/dL — ABNORMAL HIGH (ref 65–99)
Glucose-Capillary: 154 mg/dL — ABNORMAL HIGH (ref 65–99)

## 2014-09-14 MED ORDER — LORAZEPAM 2 MG/ML IJ SOLN
INTRAMUSCULAR | Status: AC
  Administered 2014-09-14: 0.25 mg
  Filled 2014-09-14: qty 1

## 2014-09-14 MED ORDER — SODIUM BICARBONATE 8.4 % IV SOLN
INTRAVENOUS | Status: AC
  Filled 2014-09-14: qty 150

## 2014-09-14 MED ORDER — TENECTEPLASE 50 MG IV KIT
45.0000 mg | PACK | INTRAVENOUS | Status: DC
  Filled 2014-09-14 (×2): qty 10

## 2014-09-14 MED ORDER — MORPHINE SULFATE 2 MG/ML IJ SOLN
INTRAMUSCULAR | Status: AC
  Filled 2014-09-14: qty 1

## 2014-09-14 MED ORDER — LORAZEPAM 2 MG/ML IJ SOLN: 0.2500 mg | Freq: Once | INTRAMUSCULAR | Status: AC

## 2014-09-14 MED ORDER — SODIUM CHLORIDE 0.9 % IV SOLN: INTRAVENOUS | Status: DC

## 2014-09-14 MED ORDER — MORPHINE SULFATE 4 MG/ML IJ SOLN
INTRAMUSCULAR | Status: AC
  Filled 2014-09-14: qty 1

## 2014-09-14 MED FILL — Medication: Qty: 1 | Status: AC

## 2014-09-15 ENCOUNTER — Telehealth: Payer: Self-pay

## 2014-09-15 LAB — CULTURE, BLOOD (ROUTINE X 2)

## 2014-09-15 NOTE — Telephone Encounter (Signed)
On 09/15/2014 I received a death certificate from [facility]. The death certificate is for burial. The patient is a patient of Doctor Sood. The death certificate will be taken to [facility] ([id number]) this afternoon for signature. On 2014/09/23 I received the death certificate back from [place]. I got the death certificate ready for pickup. I called the funeral home to let them know the death certificate was ready for pickup.

## 2014-09-15 NOTE — Discharge Summary (Signed)
Mario Shields was a 54 y.o. male admitted on 10-09-2014 with altered mental status, PEA cardiac arrest, respiratory failure, and shock.  He was intubated.  He was seen by cardiology.  He had CT chest which showed acute pulmonary embolism.  He was started on heparin gtt.  He was evaluated by IR for catheter directed thrombolytic.  He was found to have DVT.  His mentation, hemodynamics, and respiratory status all improved.  He was off pressors and extubated in morning of 7/17.  Due to hemodynamic improvement, decision was made to defer catheter directed thrombolysis.  As the day progressed he developed progressive hypotension and tachycardia.  He then developed cardiac arrest.  He was intubated and CPR started.  This was unsuccessful.  He expired on 09/01/2014.  Final diagnoses: Acute pulmonary embolism Acute DVT Lt popliteal vein Acute respiratory failure Acute metabolic encephalopathy Distributive shock PEA cardiac arrest Lactic acidosis Nausea with vomiting Hx of multiple sclerosis Tobacco abuse Right bundle branch block Hx of hypertension Hypokalemia Hx diabetes mellitus type II Hx of depression  Coralyn Helling, MD [facility] Pulmonary/Critical Care 09/15/2014, 10:04 AM Pager:  [phone] After 3pm call: [phone]

## 2014-09-17 LAB — CULTURE, BLOOD (ROUTINE X 2): Culture: NO GROWTH

## 2014-09-28 NOTE — Progress Notes (Signed)
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 Two brothers and three sisters were gathered outside pt's room. Chaplain provided emotional and spiritual support during the code and afterwards as family grieved.

## 2014-09-28 NOTE — Anesthesia Procedure Notes (Signed)
Procedure Name: Intubation Date/Time: 09/01/2014 1:28 AM Performed by: Brien Mates D Pre-anesthesia Checklist: Patient identified, Emergency Drugs available, Suction available, Patient being monitored and Timeout performed Patient Re-evaluated:Patient Re-evaluated prior to inductionOxygen Delivery Method: Circle system utilized Preoxygenation: Pre-oxygenation with 100% oxygen Ventilation: Mask ventilation without difficulty Laryngoscope Size: Glidescope Grade View: Grade I Tube type: Subglottic suction tube Tube size: 8.0 mm Number of attempts: 2 (one DL (Miller 2) with no attempt at placeing ETT. Second attempt with Glidescope with 8.0 ETT easily placed .) Airway Equipment and Method: Stylet and Video-laryngoscopy Placement Confirmation: ETT inserted through vocal cords under direct vision,  CO2 detector and breath sounds checked- equal and bilateral Secured at: 23 cm Tube secured with: Tape Dental Injury: Teeth and Oropharynx as per pre-operative assessment

## 2014-09-28 NOTE — Progress Notes (Signed)
Asystole at 0121;family at bedside during the code process.

## 2014-09-28 NOTE — Code Documentation (Signed)
  Patient Name: Mario Shields   MRN: [id number]   Date of Birth/ Sex: 07/15/1960 , male      Admission Date: 09/19/2014  Attending Provider: Coralyn Helling, MD  Primary Diagnosis: Cardiac arrest [I46.9] Lactic acidosis [E87.2] Altered mental status [R41.82] Nausea & vomiting [R11.2] Elevated troponin [R79.89] N&V (nausea and vomiting) [R11.2] Nausea and vomiting in adult [R11.0] Acute respiratory failure, unspecified whether with hypoxia or hypercapnia [J96.00] Syncope, unspecified syncope type [R55]   Indication: Pt was in his usual state of health until this PM, when he was noted to be nonresponsive. Code blue was subsequently called. At the time of arrival on scene, ACLS protocol was underway.   Technical Description:  - CPR performance duration:  20  minutes  - Was defibrillation or cardioversion used? Yes   - Was external pacer placed? Yes  - Was patient intubated pre/post CPR? Yes   Medications Administered: Y = Yes; Blank = No Amiodarone  Y  Atropine    Calcium    Epinephrine  7  Lidocaine    Magnesium  2G  Norepinephrine    Phenylephrine    Sodium bicarbonate  8  Vasopressin    Other TNK   Post CPR evaluation:  - Final Status - Was patient successfully resuscitated ? No   Miscellaneous Information:  - Time of death:  0130  AM  - Primary team notified?  Yes  - Family Notified? Yes     Jana Half, MD   08/28/2014, 2:03 AM

## 2014-09-28 NOTE — Progress Notes (Signed)
Code Blue called on patient, upon entering room patient having CPR with good chest compressions and being bagged via mask with 100% O2. Assisted patient being intubated by CRNA time two attempts and with Glide scope Fiberoptic blade. Patient intubated with an 8.0 tube and had positive EtCo2 and BBS. Placed on End Tidal through the Zoll and readings range from 14-18 throughout CPR. CPR stopped and patient had no respiratory efforts. Tube left in at time until notification to remove by M.E.

## 2014-09-28 NOTE — Progress Notes (Signed)
Evaluated patient after BiPAP placed, approximately 11:30 PM. Much improved clinical exam on BiPAP, but still somewhat agitated. Given 0.25 mg of Ativan with some improvement.  About 30 minutes after giving ativan, patient developed sudden onset bradycardia, and was found to be pulseless. A code was called and over the next 30 minutes, the patient was intubated, given several doses of epinephrine, bicarbonate, magnesium, amiodarone as well as receiving thrombolytics (tenecteplase, 45 mg). ROSC was achieved, but did not persist. After 30 minutes, I reccommended to the family (who was outside the room and able to witness the code) that we call the code. They assented, and the code was called at 1:30.  Jamie Kato, MD Pulmonary & Critical Care Medicine Oct 07, 2014, 2:27 AM   CRITICAL CARE Performed by: Jamie Kato   Total critical care time: 35 minutes  Critical care time was exclusive of separately billable procedures and treating other patients.  Critical care was necessary to treat or prevent imminent or life-threatening deterioration.  Critical care was time spent personally by me on the following activities: development of treatment plan with patient and/or surrogate as well as nursing, discussions with consultants, evaluation of patient's response to treatment, examination of patient, obtaining history from patient or surrogate, ordering and performing treatments and interventions, ordering and review of laboratory studies, ordering and review of radiographic studies, pulse oximetry and re-evaluation of patient's condition.

## 2014-09-28 DEATH — deceased

## 2017-04-18 IMAGING — CT CT ABD-PELV W/ CM
2 of 5 series · 15 of 46 positions shown, 17 images · IV contrast (APPLIED)
Comparison: None.

CLINICAL DATA: Syncope, nausea and vomiting

EXAM:
CT ABDOMEN AND PELVIS WITH CONTRAST
TECHNIQUE: Multidetector CT imaging of the abdomen and pelvis was performed
using the standard protocol following bolus administration of
intravenous contrast.
CONTRAST:  100mL OMNIPAQUE IOHEXOL 350 MG/ML SOLN

[Series 2: abd/ pelvis 5.0 i30f 1 · axial · 0.79mm/px · z∈[-811,-376]mm · 12 of 99 slices shown, 14 images]
[im 6/99  soft-tissue]
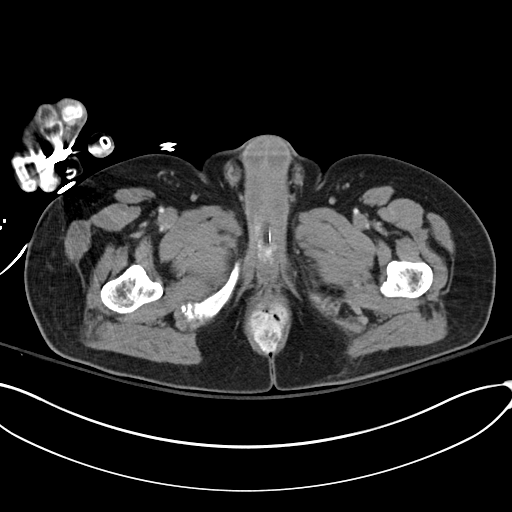
[im 6/99  bone]
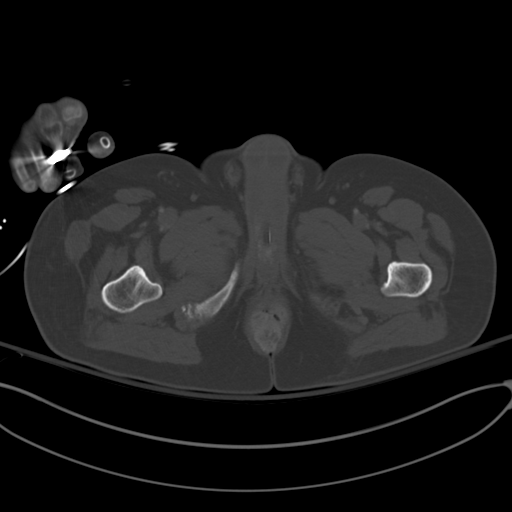
[im 16/99  soft-tissue]
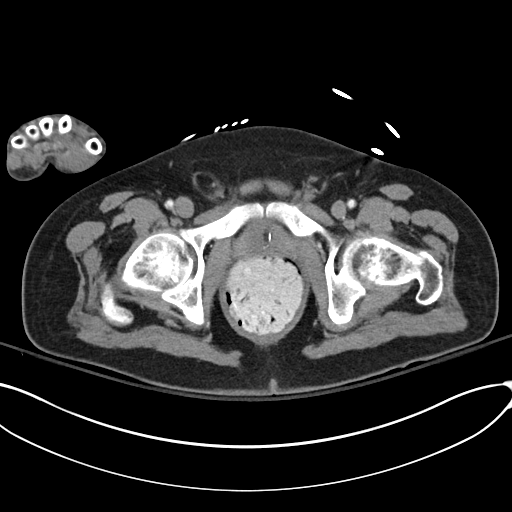
[im 21/99  soft-tissue]
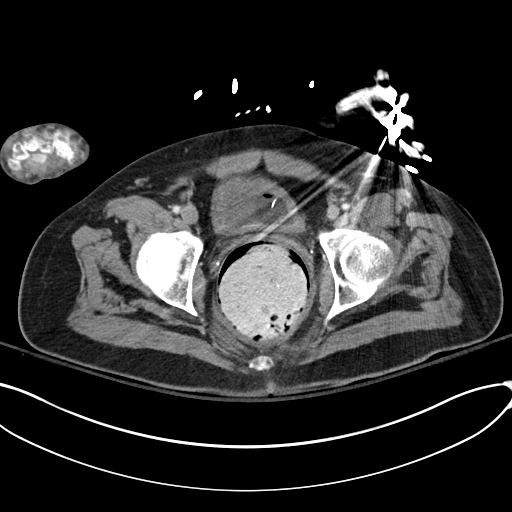
[im 31/99  soft-tissue]
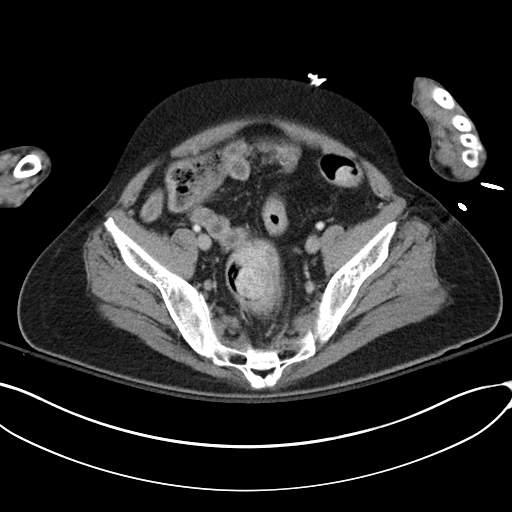
[im 37/99  soft-tissue]
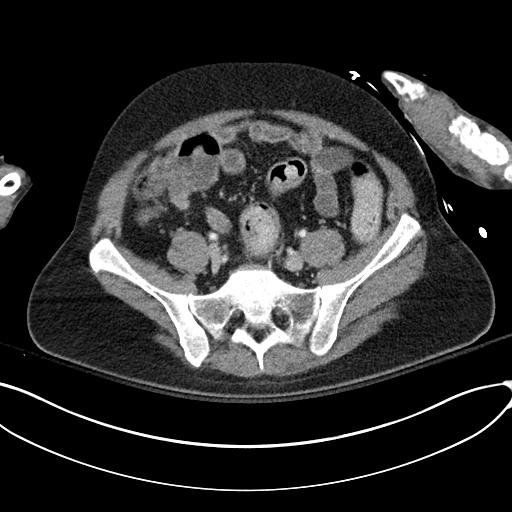
[im 47/99  soft-tissue]
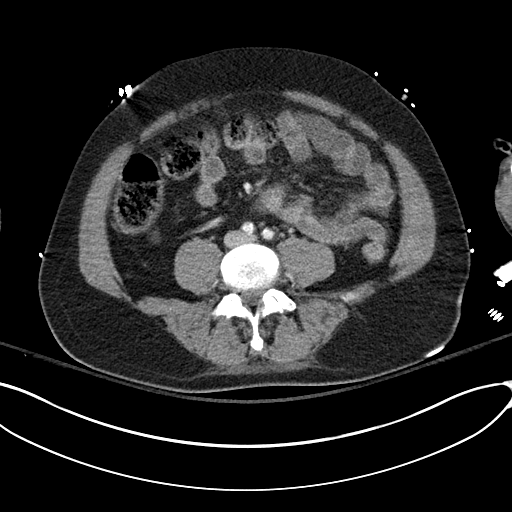
[im 52/99  soft-tissue]
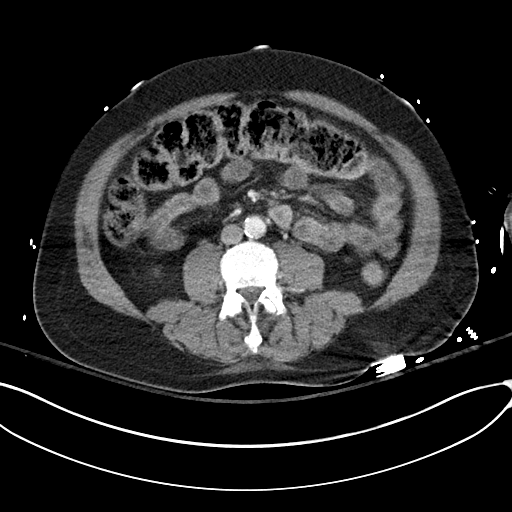
[im 62/99  soft-tissue]
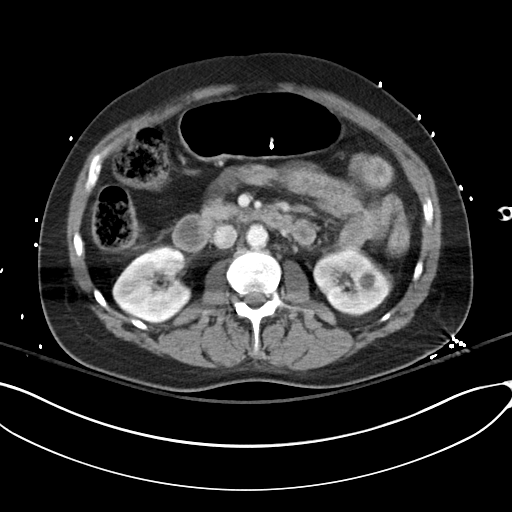
[im 68/99  soft-tissue]
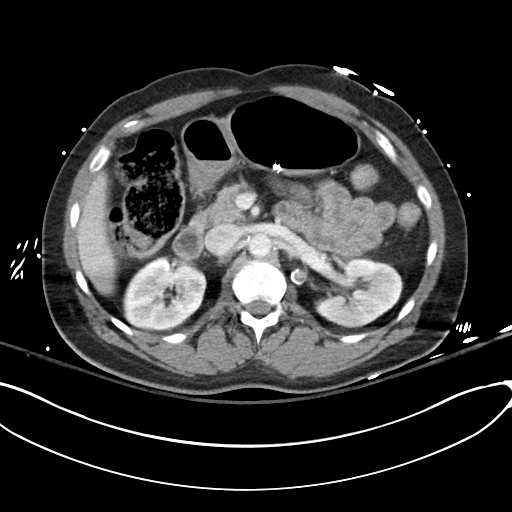
[im 68/99  bone]
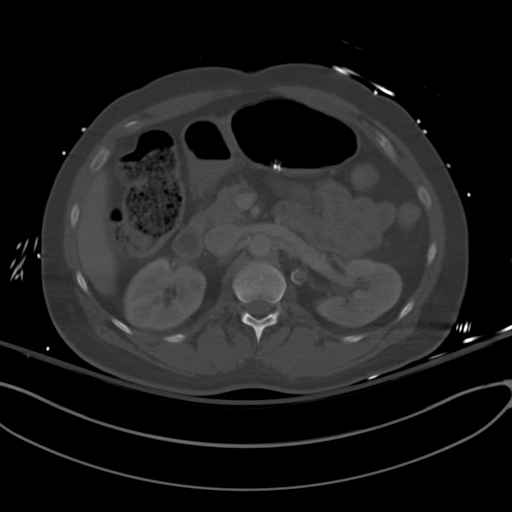
[im 78/99  soft-tissue]
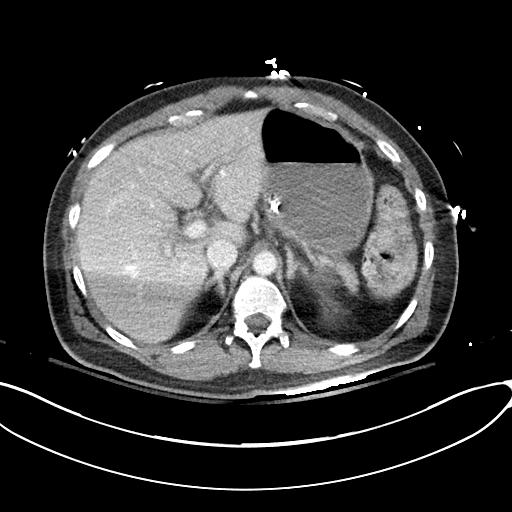
[im 83/99  soft-tissue]
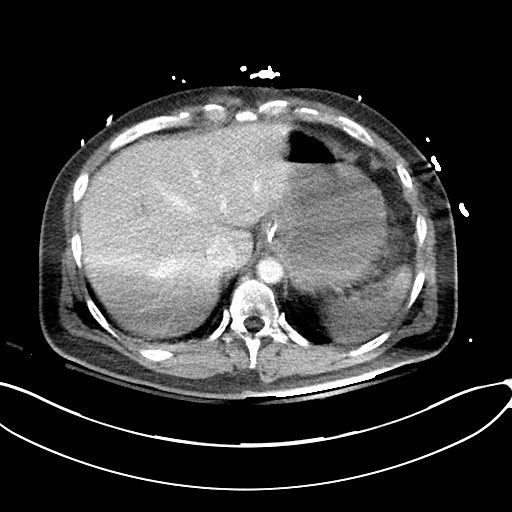
[im 93/99  soft-tissue]
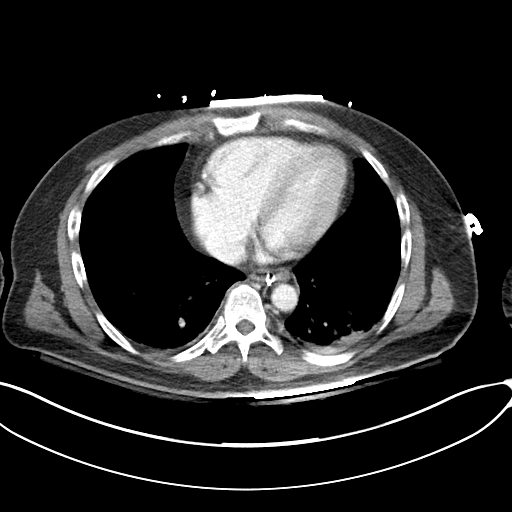

[Series 5: coronals · coronal · 0.77mm/px · 3 of 101 slices shown]
[im 34/101  soft-tissue]
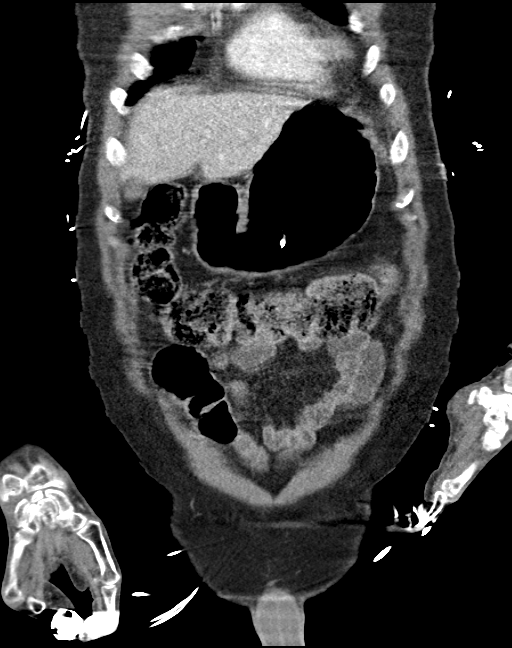
[im 45/101  soft-tissue]
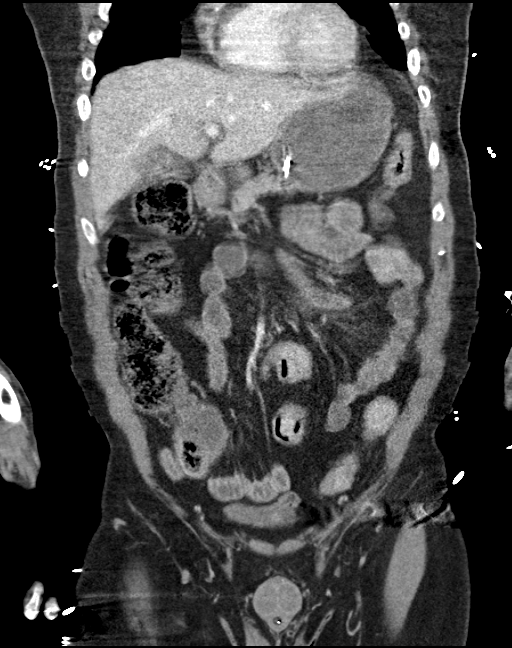
[im 56/101  soft-tissue]
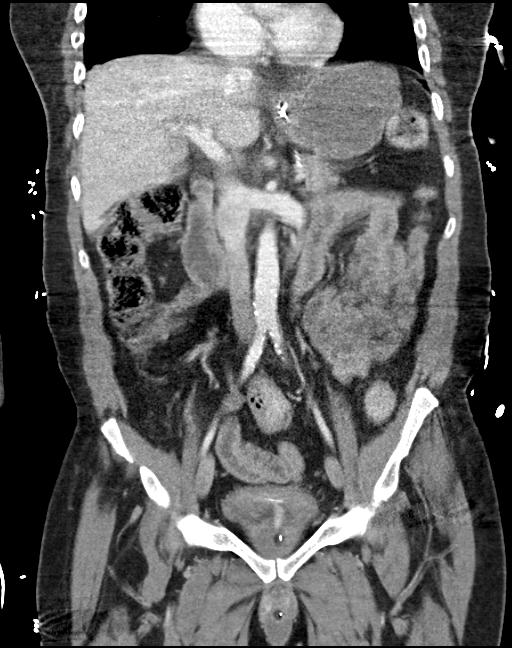

[15 of 46 positions shown; findings below may reference images not displayed]

FINDINGS: Lung bases shows small atelectasis left base posteriorly. Sagittal
images of the spine are unremarkable. NG tube partially visualized.

The gallbladder is contracted. There is subtle mild enhancement and
mild thickening of gallbladder wall without pericholecystic fluid.
Further correlation with gallbladder ultrasound is suggested to
exclude gallbladder wall inflammation.

The pancreas, spleen and adrenal glands are unremarkable.
Atherosclerotic plaques and calcifications are noted within
abdominal aorta and iliac arteries. No aortic aneurysm.

Kidneys are symmetrical in size and enhancement. No hydronephrosis
or hydroureter. There is a cyst in upper pole of the left kidney
measures she 3.6 cm.

There is a calcified aneurysm in left renal hilum measures 1.2 cm.

Moderate stool noted in right colon and transverse colon. There is
no pericecal inflammation. The terminal ileum is unremarkable. The
patient is status post appendectomy. There is abundant stool in
distal sigmoid colon and rectum. The rectum is markedly distended
with stool measures at least 7.6 cm in diameter highly suspicious
for fecal impaction.

Delayed renal images shows bilateral renal symmetrical excretion.
There is a Foley catheter in a decompressed urinary bladder. There
is thickening of urinary bladder wall. Cystitis or bladder
inflammation cannot be excluded.
IMPRESSION: 1. There is NG tube in place with tip in distal stomach.
2. There is a contracted gallbladder. Minimal enhancement and
thickening of gallbladder wall. Although this may be due to
collapsed wall inflammation cannot be completely excluded. Further
correlation with gallbladder ultrasound is suggested as clinically
warranted.
3. There is a calcified aneurysm in left renal hilum measures
cm.
4. No hydronephrosis or hydroureter.
5. There is abundant stool in distal sigmoid colon and rectum. The
rectum measures at least 7.6 cm in diameter highly suspicious for
fecal impaction.
6. There is a Foley catheter [REDACTED]ompressed urinary bladder.
Thickening of urinary bladder wall. Cystitis or chronic bladder
inflammation cannot be excluded. Clinical correlation is necessary.

## 2017-04-18 IMAGING — CR DG ABD PORTABLE 1V
1 series · 1 of 1 positions shown · non-contrast
Comparison: None.

CLINICAL DATA: Check nasogastric catheter placement

EXAM:
PORTABLE ABDOMEN - 1 VIEW

[AP]
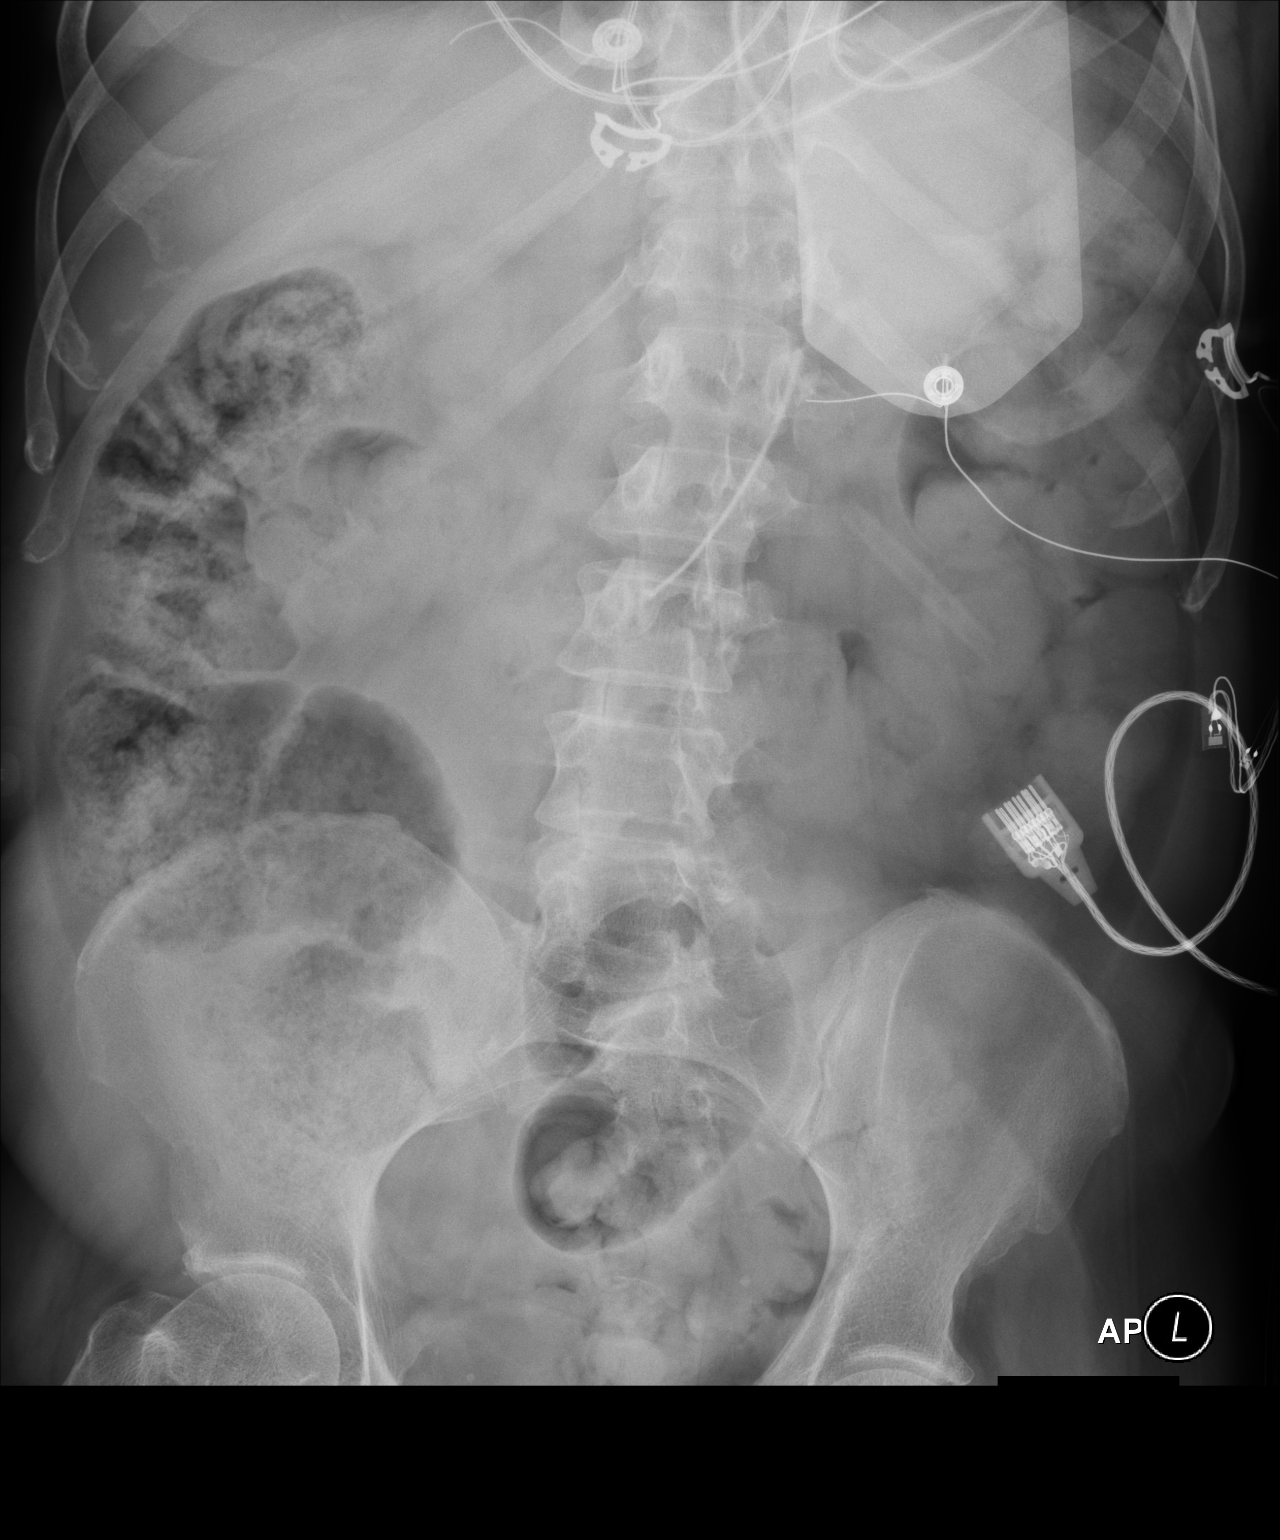

[1 of 1 positions shown; findings below may reference images not displayed]

FINDINGS: Scattered large and small bowel gas is noted. A nasogastric catheter
is noted within the stomach. No acute bony abnormality is seen.
IMPRESSION: Nasogastric catheter within the stomach.
